# Patient Record
Sex: Male | Born: 1955 | Race: Black or African American | Hispanic: No | Marital: Married | Smoking: Never smoker
Health system: Southern US, Community
[De-identification: ages and names within clinical notes are randomized; demographics above are authoritative.]

## PROBLEM LIST (undated history)

## (undated) DIAGNOSIS — I428 Other cardiomyopathies: Secondary | ICD-10-CM

## (undated) DIAGNOSIS — K219 Gastro-esophageal reflux disease without esophagitis: Secondary | ICD-10-CM

## (undated) DIAGNOSIS — E1165 Type 2 diabetes mellitus with hyperglycemia: Secondary | ICD-10-CM

## (undated) DIAGNOSIS — IMO0002 Reserved for concepts with insufficient information to code with codable children: Secondary | ICD-10-CM

## (undated) DIAGNOSIS — I10 Essential (primary) hypertension: Secondary | ICD-10-CM

## (undated) HISTORY — DX: Other cardiomyopathies: I42.8

---

## 2009-10-14 ENCOUNTER — Encounter: Admission: RE | Admit: 2009-10-14 | Discharge: 2009-10-30 | Payer: Self-pay | Admitting: Orthopedic Surgery

## 2009-11-03 ENCOUNTER — Encounter: Admission: RE | Admit: 2009-11-03 | Discharge: 2009-11-03 | Payer: Self-pay | Admitting: Orthopedic Surgery

## 2013-01-11 ENCOUNTER — Emergency Department (HOSPITAL_COMMUNITY)
Admission: EM | Admit: 2013-01-11 | Discharge: 2013-01-11 | Disposition: A | Discharge status: Home / Self Care | Payer: Self-pay | Attending: Emergency Medicine | Admitting: Emergency Medicine

## 2013-01-11 ENCOUNTER — Encounter (HOSPITAL_COMMUNITY): Payer: Self-pay | Admitting: Emergency Medicine

## 2013-01-11 ENCOUNTER — Emergency Department (HOSPITAL_COMMUNITY): Payer: Self-pay

## 2013-01-11 DIAGNOSIS — R109 Unspecified abdominal pain: Secondary | ICD-10-CM | POA: Insufficient documentation

## 2013-01-11 DIAGNOSIS — Z8719 Personal history of other diseases of the digestive system: Secondary | ICD-10-CM | POA: Insufficient documentation

## 2013-01-11 DIAGNOSIS — E119 Type 2 diabetes mellitus without complications: Secondary | ICD-10-CM | POA: Insufficient documentation

## 2013-01-11 DIAGNOSIS — K529 Noninfective gastroenteritis and colitis, unspecified: Secondary | ICD-10-CM

## 2013-01-11 DIAGNOSIS — K5289 Other specified noninfective gastroenteritis and colitis: Secondary | ICD-10-CM | POA: Insufficient documentation

## 2013-01-11 DIAGNOSIS — IMO0001 Reserved for inherently not codable concepts without codable children: Secondary | ICD-10-CM | POA: Insufficient documentation

## 2013-01-11 DIAGNOSIS — N2889 Other specified disorders of kidney and ureter: Secondary | ICD-10-CM | POA: Insufficient documentation

## 2013-01-11 DIAGNOSIS — N289 Disorder of kidney and ureter, unspecified: Secondary | ICD-10-CM

## 2013-01-11 DIAGNOSIS — I10 Essential (primary) hypertension: Secondary | ICD-10-CM | POA: Insufficient documentation

## 2013-01-11 HISTORY — DX: Gastro-esophageal reflux disease without esophagitis: K21.9

## 2013-01-11 LAB — URINE MICROSCOPIC-ADD ON

## 2013-01-11 LAB — URINALYSIS, ROUTINE W REFLEX MICROSCOPIC
Glucose, UA: >1000 mg/dL — AB
Leukocytes, UA: NEGATIVE
Protein, ur: NEGATIVE mg/dL
Urobilinogen, UA: 0.2 mg/dL (ref 0.0–1.0)

## 2013-01-11 LAB — COMPREHENSIVE METABOLIC PANEL
Albumin: 4.1 g/dL (ref 3.5–5.2)
BUN: 22 mg/dL (ref 6–23)
Chloride: 98 mEq/L (ref 96–112)
Creatinine, Ser: 1.52 mg/dL — ABNORMAL HIGH (ref 0.50–1.35)
GFR calc non Af Amer: 49 mL/min — ABNORMAL LOW (ref >90)
Total Bilirubin: 0.3 mg/dL (ref 0.3–1.2)

## 2013-01-11 LAB — CBC WITH DIFFERENTIAL/PLATELET
Basophils Relative: 0 % (ref 0–1)
Eosinophils Relative: 1 % (ref 0–5)
HCT: 37.7 % — ABNORMAL LOW (ref 39.0–52.0)
Hemoglobin: 12.7 g/dL — ABNORMAL LOW (ref 13.0–17.0)
MCH: 26.1 pg (ref 26.0–34.0)
MCHC: 33.7 g/dL (ref 30.0–36.0)
MCV: 77.6 fL — ABNORMAL LOW (ref 78.0–100.0)
Monocytes Absolute: 0.9 10*3/uL (ref 0.1–1.0)
Monocytes Relative: 7 % (ref 3–12)
Neutro Abs: 12.7 10*3/uL — ABNORMAL HIGH (ref 1.7–7.7)

## 2013-01-11 LAB — LIPASE, BLOOD: Lipase: 30 U/L (ref 11–59)

## 2013-01-11 LAB — HEPATIC FUNCTION PANEL: Total Protein: 8.7 g/dL — ABNORMAL HIGH (ref 6.0–8.3)

## 2013-01-11 MED ORDER — IOHEXOL 300 MG/ML  SOLN
50.0000 mL | Freq: Once | INTRAMUSCULAR | Status: AC | PRN
  Administered 2013-01-11: 50 mL via ORAL

## 2013-01-11 MED ORDER — ONDANSETRON HCL 4 MG/2ML IJ SOLN
4.0000 mg | Freq: Once | INTRAMUSCULAR | Status: AC
  Administered 2013-01-11: 4 mg via INTRAVENOUS
  Filled 2013-01-11: qty 2

## 2013-01-11 MED ORDER — SODIUM CHLORIDE 0.9 % IV BOLUS (SEPSIS)
1000.0000 mL | Freq: Once | INTRAVENOUS | Status: AC
  Administered 2013-01-11: 1000 mL via INTRAVENOUS

## 2013-01-11 MED ORDER — METRONIDAZOLE 500 MG PO TABS: 500.0000 mg | ORAL_TABLET | Freq: Three times a day (TID) | ORAL | Status: DC

## 2013-01-11 MED ORDER — CIPROFLOXACIN HCL 500 MG PO TABS: 500.0000 mg | ORAL_TABLET | Freq: Two times a day (BID) | ORAL | Status: DC

## 2013-01-11 MED ORDER — FENTANYL CITRATE 0.05 MG/ML IJ SOLN
50.0000 ug | Freq: Once | INTRAMUSCULAR | Status: AC
  Administered 2013-01-11: 50 ug via INTRAVENOUS
  Filled 2013-01-11 (×2): qty 2

## 2013-01-11 NOTE — ED Provider Notes (Signed)
History     CSN: [id number]  Arrival date & time 01/11/13  0137   First MD Initiated Contact with Patient 01/11/13 [phone]      Chief Complaint  Patient presents with  . Emesis    (Consider location/radiation/quality/duration/timing/severity/associated sxs/prior treatment) Patient is a 57 y.o. male presenting with vomiting. The history is provided by the patient.  Emesis Severity:  Moderate Timing:  Constant Number of daily episodes:  3 Quality:  Stomach contents Progression:  Unchanged Chronicity:  New Recent urination:  Normal Context: not self-induced   Relieved by:  Nothing Worsened by:  Nothing tried Ineffective treatments:  None tried Associated symptoms: abdominal pain and myalgias   Associated symptoms: no diarrhea   Abdominal pain:    Location:  Suprapubic   Quality:  Cramping   Severity:  Moderate   Onset quality:  Gradual   Timing:  Constant   Progression:  Unchanged   Chronicity:  New Risk factors: diabetes     Past Medical History  Diagnosis Date  . Diabetes mellitus without complication   . Hypertension   . GERD (gastroesophageal reflux disease)     History reviewed. No pertinent past surgical history.  No family history on file.  History  Substance Use Topics  . Smoking status: Never Smoker   . Smokeless tobacco: Not on file  . Alcohol Use: No      Review of Systems  Respiratory: Negative for shortness of breath.   Cardiovascular: Negative for chest pain.  Gastrointestinal: Positive for nausea, vomiting and abdominal pain. Negative for diarrhea.  Musculoskeletal: Positive for myalgias.  All other systems reviewed and are negative.    Allergies  Review of patient's allergies indicates no known allergies.  Home Medications  No current outpatient prescriptions on file.  BP 133/91  Pulse 126  Temp(Src) 98.7 F (37.1 C) (Oral)  Resp 16  Ht 5' 11.5" (1.816 m)  Wt 173 lb (78.472 kg)  BMI 23.79 kg/m2  SpO2 97%  Physical Exam   Constitutional: He is oriented to person, place, and time. He appears well-developed and well-nourished. No distress.  HENT:  Head: Normocephalic and atraumatic.  Mouth/Throat: Oropharynx is clear and moist.  Eyes: Conjunctivae are normal. Pupils are equal, round, and reactive to light.  Neck: Normal range of motion. Neck supple.  Cardiovascular: Tachycardia present.   Pulmonary/Chest: Effort normal and breath sounds normal. He has no wheezes. He has no rales.  Abdominal: Soft. Bowel sounds are normal. There is no tenderness. There is no rebound and no guarding.  Musculoskeletal: Normal range of motion. He exhibits no edema.  Neurological: He is alert and oriented to person, place, and time.  Skin: Skin is warm and dry.  Psychiatric: He has a normal mood and affect.    ED Course  Procedures (including critical care time)  Labs Reviewed  CBC WITH DIFFERENTIAL - Abnormal; Notable for the following:    WBC 14.4 (*)    Hemoglobin 12.7 (*)    HCT 37.7 (*)    MCV 77.6 (*)    Neutrophils Relative 89 (*)    Neutro Abs 12.7 (*)    Lymphocytes Relative 4 (*)    Lymphs Abs 0.5 (*)    All other components within normal limits  COMPREHENSIVE METABOLIC PANEL - Abnormal; Notable for the following:    Glucose, Bld 290 (*)    Creatinine, Ser 1.52 (*)    Total Protein 8.5 (*)    GFR calc non Af Amer 49 (*)  GFR calc Af Amer 57 (*)    All other components within normal limits  URINALYSIS, ROUTINE W REFLEX MICROSCOPIC  URINALYSIS, ROUTINE W REFLEX MICROSCOPIC  HEPATIC FUNCTION PANEL  LIPASE, BLOOD   Dg Abd Acute W/chest  01/11/2013  *RADIOLOGY REPORT*  Clinical Data: Abdominal pain, vomiting.  ACUTE ABDOMEN SERIES (ABDOMEN 2 VIEW & CHEST 1 VIEW)  Comparison: None.  Findings: Lungs are clear.  Cardiomediastinal contours within normal range.  No free intraperitoneal air. The bowel gas pattern is non- obstructive. Organ outlines are normal where seen. No acute or aggressive osseous abnormality  identified.  IMPRESSION: Nonobstructive bowel gas pattern.   Original Report Authenticated By: Jearld Lesch, M.D.      No diagnosis found.    MDM  Tachycardia resolved with IVF.  Symptoms consistent with infectious enteritis.  Follow up for outpatient endoscopy and outpatient renal ultrasound to further characterize lesion seen on CT. Return for fevers over 101, recurrent abdominal pain intractable vomiting or any concerns.  Both patient and wife verbalize understanding and agree to follow up        April Smitty Cords, MD 01/11/13 [phone]
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
## 2013-09-27 ENCOUNTER — Encounter (HOSPITAL_COMMUNITY): Payer: Self-pay | Admitting: Emergency Medicine

## 2013-09-27 ENCOUNTER — Emergency Department (HOSPITAL_COMMUNITY): Payer: No Typology Code available for payment source

## 2013-09-27 ENCOUNTER — Emergency Department (HOSPITAL_COMMUNITY)
Admission: EM | Admit: 2013-09-27 | Discharge: 2013-09-27 | Disposition: A | Discharge status: Home / Self Care | Payer: Self-pay | Attending: Emergency Medicine | Admitting: Emergency Medicine

## 2013-09-27 DIAGNOSIS — S4980XA Other specified injuries of shoulder and upper arm, unspecified arm, initial encounter: Secondary | ICD-10-CM | POA: Insufficient documentation

## 2013-09-27 DIAGNOSIS — Y92410 Unspecified street and highway as the place of occurrence of the external cause: Secondary | ICD-10-CM | POA: Insufficient documentation

## 2013-09-27 DIAGNOSIS — E119 Type 2 diabetes mellitus without complications: Secondary | ICD-10-CM | POA: Insufficient documentation

## 2013-09-27 DIAGNOSIS — Y9389 Activity, other specified: Secondary | ICD-10-CM | POA: Insufficient documentation

## 2013-09-27 DIAGNOSIS — IMO0002 Reserved for concepts with insufficient information to code with codable children: Secondary | ICD-10-CM | POA: Insufficient documentation

## 2013-09-27 DIAGNOSIS — S46909A Unspecified injury of unspecified muscle, fascia and tendon at shoulder and upper arm level, unspecified arm, initial encounter: Secondary | ICD-10-CM | POA: Insufficient documentation

## 2013-09-27 DIAGNOSIS — I10 Essential (primary) hypertension: Secondary | ICD-10-CM | POA: Insufficient documentation

## 2013-09-27 DIAGNOSIS — S298XXA Other specified injuries of thorax, initial encounter: Secondary | ICD-10-CM | POA: Insufficient documentation

## 2013-09-27 DIAGNOSIS — Z8719 Personal history of other diseases of the digestive system: Secondary | ICD-10-CM | POA: Insufficient documentation

## 2013-09-27 DIAGNOSIS — Z79899 Other long term (current) drug therapy: Secondary | ICD-10-CM | POA: Insufficient documentation

## 2013-09-27 DIAGNOSIS — R0789 Other chest pain: Secondary | ICD-10-CM

## 2013-09-27 MED ORDER — OXYCODONE-ACETAMINOPHEN 5-325 MG PO TABS
1.0000 | ORAL_TABLET | Freq: Once | ORAL | Status: AC
  Administered 2013-09-27: 1 via ORAL
  Filled 2013-09-27: qty 1

## 2013-09-27 NOTE — ED Notes (Signed)
Small cut above the rt eye

## 2013-09-27 NOTE — ED Notes (Signed)
The pt has no other pain except  For his chest with movement.  Warm blanket given
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
## 2013-09-27 NOTE — ED Notes (Signed)
The pt just returned from xray 
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
## 2013-09-27 NOTE — ED Notes (Signed)
Med given  Pt to xray.    

## 2013-09-27 NOTE — ED Notes (Signed)
The pt is ready for discharge  Dressed and waiting.  His papers are not ready

## 2013-09-27 NOTE — ED Notes (Signed)
Cut rt eyebrow cleaned with soap and water

## 2013-09-27 NOTE — ED Notes (Signed)
The pt is still waiting to be seen.   Another blanket given
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
## 2014-12-31 DIAGNOSIS — I428 Other cardiomyopathies: Secondary | ICD-10-CM

## 2014-12-31 HISTORY — DX: Other cardiomyopathies: I42.8

## 2015-01-04 ENCOUNTER — Other Ambulatory Visit (HOSPITAL_COMMUNITY): Payer: Self-pay

## 2015-01-04 ENCOUNTER — Inpatient Hospital Stay (HOSPITAL_COMMUNITY)
Admission: EM | Admit: 2015-01-04 | Discharge: 2015-01-08 | DRG: 286 | Disposition: A | Discharge status: Home / Self Care | Payer: Self-pay | Attending: Internal Medicine | Admitting: Internal Medicine

## 2015-01-04 ENCOUNTER — Emergency Department (HOSPITAL_COMMUNITY): Payer: Self-pay

## 2015-01-04 ENCOUNTER — Encounter (HOSPITAL_COMMUNITY): Payer: Self-pay | Admitting: Physical Medicine and Rehabilitation

## 2015-01-04 DIAGNOSIS — J811 Chronic pulmonary edema: Secondary | ICD-10-CM | POA: Diagnosis present

## 2015-01-04 DIAGNOSIS — I428 Other cardiomyopathies: Secondary | ICD-10-CM | POA: Insufficient documentation

## 2015-01-04 DIAGNOSIS — Z23 Encounter for immunization: Secondary | ICD-10-CM

## 2015-01-04 DIAGNOSIS — K219 Gastro-esophageal reflux disease without esophagitis: Secondary | ICD-10-CM | POA: Diagnosis present

## 2015-01-04 DIAGNOSIS — I5041 Acute combined systolic (congestive) and diastolic (congestive) heart failure: Principal | ICD-10-CM | POA: Diagnosis present

## 2015-01-04 DIAGNOSIS — T502X5A Adverse effect of carbonic-anhydrase inhibitors, benzothiadiazides and other diuretics, initial encounter: Secondary | ICD-10-CM | POA: Diagnosis not present

## 2015-01-04 DIAGNOSIS — I2510 Atherosclerotic heart disease of native coronary artery without angina pectoris: Secondary | ICD-10-CM | POA: Diagnosis present

## 2015-01-04 DIAGNOSIS — J9600 Acute respiratory failure, unspecified whether with hypoxia or hypercapnia: Secondary | ICD-10-CM | POA: Diagnosis present

## 2015-01-04 DIAGNOSIS — R0602 Shortness of breath: Secondary | ICD-10-CM

## 2015-01-04 DIAGNOSIS — E785 Hyperlipidemia, unspecified: Secondary | ICD-10-CM | POA: Diagnosis present

## 2015-01-04 DIAGNOSIS — E876 Hypokalemia: Secondary | ICD-10-CM | POA: Diagnosis present

## 2015-01-04 DIAGNOSIS — I420 Dilated cardiomyopathy: Secondary | ICD-10-CM | POA: Diagnosis present

## 2015-01-04 DIAGNOSIS — I5042 Chronic combined systolic (congestive) and diastolic (congestive) heart failure: Secondary | ICD-10-CM | POA: Clinically undetermined

## 2015-01-04 DIAGNOSIS — E1165 Type 2 diabetes mellitus with hyperglycemia: Secondary | ICD-10-CM | POA: Diagnosis present

## 2015-01-04 DIAGNOSIS — IMO0002 Reserved for concepts with insufficient information to code with codable children: Secondary | ICD-10-CM | POA: Diagnosis present

## 2015-01-04 DIAGNOSIS — I25119 Atherosclerotic heart disease of native coronary artery with unspecified angina pectoris: Secondary | ICD-10-CM | POA: Diagnosis present

## 2015-01-04 DIAGNOSIS — J810 Acute pulmonary edema: Secondary | ICD-10-CM

## 2015-01-04 DIAGNOSIS — D649 Anemia, unspecified: Secondary | ICD-10-CM | POA: Diagnosis present

## 2015-01-04 DIAGNOSIS — I10 Essential (primary) hypertension: Secondary | ICD-10-CM | POA: Diagnosis present

## 2015-01-04 DIAGNOSIS — E119 Type 2 diabetes mellitus without complications: Secondary | ICD-10-CM

## 2015-01-04 HISTORY — DX: Type 2 diabetes mellitus with hyperglycemia: E11.65

## 2015-01-04 HISTORY — DX: Essential (primary) hypertension: I10

## 2015-01-04 HISTORY — DX: Reserved for concepts with insufficient information to code with codable children: IMO0002

## 2015-01-04 LAB — CBC WITH DIFFERENTIAL/PLATELET
BASOS ABS: 0 10*3/uL (ref 0.0–0.1)
Basophils Relative: 0 % (ref 0–1)
EOS ABS: 0.1 10*3/uL (ref 0.0–0.7)
EOS PCT: 1 % (ref 0–5)
HEMATOCRIT: 36.6 % — AB (ref 39.0–52.0)
Hemoglobin: 11.9 g/dL — ABNORMAL LOW (ref 13.0–17.0)
LYMPHS PCT: 18 % (ref 12–46)
Lymphs Abs: 1.8 10*3/uL (ref 0.7–4.0)
MCH: 26 pg (ref 26.0–34.0)
MCHC: 32.5 g/dL (ref 30.0–36.0)
MCV: 79.9 fL (ref 78.0–100.0)
MONO ABS: 0.7 10*3/uL (ref 0.1–1.0)
MONOS PCT: 7 % (ref 3–12)
Neutro Abs: 7.4 10*3/uL (ref 1.7–7.7)
Neutrophils Relative %: 74 % (ref 43–77)
Platelets: 234 10*3/uL (ref 150–400)
RBC: 4.58 MIL/uL (ref 4.22–5.81)
RDW: 15.8 % — ABNORMAL HIGH (ref 11.5–15.5)
WBC: 10 10*3/uL (ref 4.0–10.5)

## 2015-01-04 LAB — COMPREHENSIVE METABOLIC PANEL
ALBUMIN: 3.6 g/dL (ref 3.5–5.2)
ALT: 8 U/L (ref 0–53)
AST: 18 U/L (ref 0–37)
Alkaline Phosphatase: 59 U/L (ref 39–117)
Anion gap: 12 (ref 5–15)
BUN: 12 mg/dL (ref 6–23)
CALCIUM: 8.9 mg/dL (ref 8.4–10.5)
CHLORIDE: 105 mmol/L (ref 96–112)
CO2: 25 mmol/L (ref 19–32)
Creatinine, Ser: 1.01 mg/dL (ref 0.50–1.35)
GFR calc Af Amer: >90 mL/min (ref >90)
GFR calc non Af Amer: 79 mL/min — ABNORMAL LOW (ref >90)
Glucose, Bld: 122 mg/dL — ABNORMAL HIGH (ref 70–99)
Potassium: 3.1 mmol/L — ABNORMAL LOW (ref 3.5–5.1)
Sodium: 142 mmol/L (ref 135–145)
Total Bilirubin: 0.7 mg/dL (ref 0.3–1.2)
Total Protein: 7.8 g/dL (ref 6.0–8.3)

## 2015-01-04 LAB — MAGNESIUM: Magnesium: 1.6 mg/dL (ref 1.5–2.5)

## 2015-01-04 LAB — I-STAT TROPONIN, ED: TROPONIN I, POC: 0.02 ng/mL (ref 0.00–0.08)

## 2015-01-04 LAB — D-DIMER, QUANTITATIVE (NOT AT ARMC): D-Dimer, Quant: 1.04 ug/mL-FEU — ABNORMAL HIGH (ref 0.00–0.48)

## 2015-01-04 LAB — BRAIN NATRIURETIC PEPTIDE: B Natriuretic Peptide: 463 pg/mL — ABNORMAL HIGH (ref 0.0–100.0)

## 2015-01-04 MED ORDER — PRAVASTATIN SODIUM 20 MG PO TABS
20.0000 mg | ORAL_TABLET | Freq: Every day | ORAL | Status: DC
  Administered 2015-01-05 – 2015-01-07 (×3): 20 mg via ORAL
  Filled 2015-01-04 (×4): qty 1

## 2015-01-04 MED ORDER — LISINOPRIL 40 MG PO TABS: 40.0000 mg | ORAL_TABLET | Freq: Every day | ORAL | Status: DC

## 2015-01-04 MED ORDER — ASPIRIN EC 81 MG PO TBEC
81.0000 mg | DELAYED_RELEASE_TABLET | Freq: Every day | ORAL | Status: DC
  Administered 2015-01-05 – 2015-01-07 (×3): 81 mg via ORAL
  Filled 2015-01-04 (×4): qty 1

## 2015-01-04 MED ORDER — SODIUM CHLORIDE 0.9 % IJ SOLN: 3.0000 mL | Freq: Two times a day (BID) | INTRAMUSCULAR | Status: DC

## 2015-01-04 MED ORDER — FUROSEMIDE 10 MG/ML IJ SOLN
40.0000 mg | Freq: Once | INTRAMUSCULAR | Status: AC
  Administered 2015-01-04: 40 mg via INTRAVENOUS
  Filled 2015-01-04: qty 4

## 2015-01-04 MED ORDER — ACETAMINOPHEN 325 MG PO TABS: 650.0000 mg | ORAL_TABLET | Freq: Four times a day (QID) | ORAL | Status: DC | PRN

## 2015-01-04 MED ORDER — ENOXAPARIN SODIUM 40 MG/0.4ML ~~LOC~~ SOLN
40.0000 mg | Freq: Every day | SUBCUTANEOUS | Status: DC
  Administered 2015-01-05 – 2015-01-06 (×2): 40 mg via SUBCUTANEOUS
  Filled 2015-01-04 (×3): qty 0.4

## 2015-01-04 MED ORDER — SODIUM CHLORIDE 0.9 % IJ SOLN
3.0000 mL | Freq: Two times a day (BID) | INTRAMUSCULAR | Status: DC
  Administered 2015-01-04 – 2015-01-08 (×7): 3 mL via INTRAVENOUS

## 2015-01-04 MED ORDER — ASPIRIN 325 MG PO TABS
325.0000 mg | ORAL_TABLET | Freq: Once | ORAL | Status: AC
  Administered 2015-01-05: 325 mg via ORAL
  Filled 2015-01-04 (×2): qty 1

## 2015-01-04 MED ORDER — MAGNESIUM SULFATE 2 GM/50ML IV SOLN
2.0000 g | Freq: Once | INTRAVENOUS | Status: AC
  Administered 2015-01-04: 2 g via INTRAVENOUS
  Filled 2015-01-04: qty 50

## 2015-01-04 MED ORDER — POTASSIUM CHLORIDE CRYS ER 20 MEQ PO TBCR
40.0000 meq | EXTENDED_RELEASE_TABLET | Freq: Once | ORAL | Status: AC
  Administered 2015-01-04: 40 meq via ORAL
  Filled 2015-01-04: qty 2

## 2015-01-04 MED ORDER — ACETAMINOPHEN 325 MG PO TABS
650.0000 mg | ORAL_TABLET | Freq: Once | ORAL | Status: AC
  Administered 2015-01-04: 650 mg via ORAL
  Filled 2015-01-04: qty 2

## 2015-01-04 MED ORDER — IOHEXOL 350 MG/ML SOLN
90.0000 mL | Freq: Once | INTRAVENOUS | Status: AC | PRN
  Administered 2015-01-04: 90 mL via INTRAVENOUS

## 2015-01-04 MED ORDER — ACETAMINOPHEN 650 MG RE SUPP: 650.0000 mg | Freq: Four times a day (QID) | RECTAL | Status: DC | PRN

## 2015-01-04 MED ORDER — INSULIN ASPART 100 UNIT/ML ~~LOC~~ SOLN
0.0000 [IU] | Freq: Three times a day (TID) | SUBCUTANEOUS | Status: DC
  Administered 2015-01-05 (×2): 2 [IU] via SUBCUTANEOUS
  Administered 2015-01-05: 1 [IU] via SUBCUTANEOUS
  Administered 2015-01-06: 2 [IU] via SUBCUTANEOUS
  Administered 2015-01-06: 3 [IU] via SUBCUTANEOUS
  Administered 2015-01-06 – 2015-01-07 (×2): 2 [IU] via SUBCUTANEOUS
  Administered 2015-01-08: 1 [IU] via SUBCUTANEOUS
  Administered 2015-01-08 (×2): 2 [IU] via SUBCUTANEOUS

## 2015-01-04 MED ORDER — ASPIRIN 325 MG PO TABS: 325.0000 mg | ORAL_TABLET | Freq: Every day | ORAL | Status: DC

## 2015-01-04 NOTE — ED Notes (Signed)
Pt ambulated in hall with o2 dropping to 92% and pulse going to 122. Pt stated he felt he needed to walk slower and felt like he was breathing harder. Pt with increased respirations while ambulating.

## 2015-01-04 NOTE — ED Notes (Signed)
Called main lab.Main lab will result BNP

## 2015-01-04 NOTE — ED Notes (Signed)
Pt placed into gown and on monitor upon arrival to room. Pt monitored by blood pressure, pulse ox, and 5 lead.  

## 2015-01-04 NOTE — ED Provider Notes (Signed)
CSN: [id number]     Arrival date & time 01/04/15  1053 History   First MD Initiated Contact with Patient 01/04/15 1148     Chief Complaint  Patient presents with  . Shortness of Breath     (Consider location/radiation/quality/duration/timing/severity/associated sxs/prior Treatment) HPI Comments: Patient is a 59 year old male with a past medical history of hypertension, diabetes, and GERD who presents with shortness of breath for the past 3 days. Symptoms started gradually and progressively worsened since the onset. Patient denies chest pain. SOB worse when laying flat. No alleviating factors. No fevers. Patient reports a dry cough. His PCP wanted him to be evaluated for CHF. Patient denies history of DVT/PE, recent surgery/travel/immobilization.    Past Medical History  Diagnosis Date  . Diabetes mellitus without complication   . Hypertension   . GERD (gastroesophageal reflux disease)    History reviewed. No pertinent past surgical history. History reviewed. No pertinent family history. History  Substance Use Topics  . Smoking status: Never Smoker   . Smokeless tobacco: Not on file  . Alcohol Use: No    Review of Systems  Constitutional: Negative for fever, chills and fatigue.  HENT: Negative for trouble swallowing.   Eyes: Negative for visual disturbance.  Respiratory: Positive for shortness of breath.   Cardiovascular: Negative for chest pain and palpitations.  Gastrointestinal: Negative for nausea, vomiting, abdominal pain and diarrhea.  Genitourinary: Negative for dysuria and difficulty urinating.  Musculoskeletal: Negative for arthralgias and neck pain.  Skin: Negative for color change.  Neurological: Negative for dizziness and weakness.  Psychiatric/Behavioral: Negative for dysphoric mood.      Allergies  Review of patient's allergies indicates no known allergies.  Home Medications   Prior to Admission medications   Medication Sig Start Date End Date Taking?  Authorizing Provider  chlorthalidone (HYGROTON) 25 MG tablet Take 25 mg by mouth daily.    Historical Provider, MD  Liraglutide (VICTOZA [place]) Inject 1.2 mLs into the skin daily.    Historical Provider, MD  lisinopril-hydrochlorothiazide (PRINZIDE,ZESTORETIC) 10-12.5 MG per tablet Take 1 tablet by mouth 2 (two) times daily.    Historical Provider, MD   BP 159/99 mmHg  Pulse 120  Temp(Src) 97.6 F (36.4 C) (Oral)  Resp 18  Ht 5\' 11"  (1.803 m)  Wt 170 lb (77.111 kg)  BMI 23.72 kg/m2  SpO2 98% Physical Exam  Constitutional: He is oriented to person, place, and time. He appears well-developed and well-nourished. No distress.  HENT:  Head: Normocephalic and atraumatic.  Eyes: Conjunctivae and EOM are normal.  Neck: Normal range of motion.  Cardiovascular: Normal rate and regular rhythm.  Exam reveals no gallop and no friction rub.   No murmur heard. Pulmonary/Chest: Effort normal and breath sounds normal. He has no wheezes. He has no rales. He exhibits no tenderness.  Diminished breath sounds in bilateral lower lobes.   Abdominal: Soft. He exhibits no distension. There is no tenderness. There is no rebound.  Musculoskeletal: Normal range of motion.  Neurological: He is alert and oriented to person, place, and time. Coordination normal.  Speech is goal-oriented. Moves limbs without ataxia.   Skin: Skin is warm and dry.  Psychiatric: He has a normal mood and affect. His behavior is normal.  Nursing note and vitals reviewed.   ED Course  Procedures (including critical care time) Labs Review Labs Reviewed  CBC WITH DIFFERENTIAL/PLATELET - Abnormal; Notable for the following:    Hemoglobin 11.9 (*)    HCT 36.6 (*)  RDW 15.8 (*)    All other components within normal limits  COMPREHENSIVE METABOLIC PANEL - Abnormal; Notable for the following:    Potassium 3.1 (*)    Glucose, Bld 122 (*)    GFR calc non Af Amer 79 (*)    All other components within normal limits  D-DIMER,  QUANTITATIVE - Abnormal; Notable for the following:    D-Dimer, Quant 1.04 (*)    All other components within normal limits  BRAIN NATRIURETIC PEPTIDE - Abnormal; Notable for the following:    B Natriuretic Peptide 463.0 (*)    All other components within normal limits  BASIC METABOLIC PANEL - Abnormal; Notable for the following:    Potassium 3.1 (*)    Glucose, Bld 148 (*)    GFR calc non Af Amer 74 (*)    GFR calc Af Amer 85 (*)    All other components within normal limits  TROPONIN I - Abnormal; Notable for the following:    Troponin I 0.04 (*)    All other components within normal limits  TROPONIN I - Abnormal; Notable for the following:    Troponin I 0.05 (*)    All other components within normal limits  GLUCOSE, CAPILLARY - Abnormal; Notable for the following:    Glucose-Capillary 130 (*)    All other components within normal limits  MAGNESIUM  TSH  LIPID PANEL  MAGNESIUM  TROPONIN I  HEMOGLOBIN A1C  HIV ANTIBODY (ROUTINE TESTING)  I-STAT TROPOININ, ED    Imaging Review Dg Chest 2 View  01/04/2015   CLINICAL DATA:  Shortness of breath. Abdominal pain and bloating since Thursday. Hypertension and diabetes.  EXAM: CHEST  2 VIEW  COMPARISON:  09/27/2013  FINDINGS: Midline trachea. Normal heart size and mediastinal contours. Trace bilateral pleural fluid. No pneumothorax. mild interstitial thickening, lower lobe predominant. Minimal left base subsegmental atelectasis.  IMPRESSION: Interstitial thickening which is new and suspicious for mild pulmonary venous congestion. Trace bilateral pleural effusions.   Electronically Signed   By: Jeronimo Greaves M.D.   On: 01/04/2015 13:00   Ct Angio Chest Pe W/cm &/or Wo Cm  01/04/2015   CLINICAL DATA:  59 year old male with shortness of breath for 3 days.  EXAM: CT ANGIOGRAPHY CHEST WITH CONTRAST  TECHNIQUE: Multidetector CT imaging of the chest was performed using the standard protocol during bolus administration of intravenous contrast.  Multiplanar CT image reconstructions and MIPs were obtained to evaluate the vascular anatomy.  CONTRAST:  90mL OMNIPAQUE IOHEXOL 350 MG/ML SOLN  COMPARISON:  01/04/2015 and prior chest radiographs. 01/11/2013 abdominal CT.  FINDINGS: This is a technically satisfactory study.  Mediastinum/Nodes: No pulmonary emboli are identified.  There is no evidence of thoracic aortic aneurysm.  Cardiomegaly and moderate coronary artery calcifications are noted. A small infarct at the left ventricular apex is noted. No enlarged lymph nodes are identified.  Lungs/Pleura: Moderate bilateral airspace/ central ground-glass opacities are noted likely representing pulmonary edema. Moderate bibasilar atelectasis is noted.  Patchy right upper lobe airspace/ground-glass opacities likely represent focal edema.  Bilateral pleural effusions, small to moderate on the right and small on the left, noted.  Upper abdomen: Unremarkable  Musculoskeletal: Unremarkable  Review of the MIP images confirms the above findings.  IMPRESSION: Moderate bilateral central airspace/ ground-glass opacities likely representing moderate pulmonary edema. Bilateral pleural effusions, small to moderate on the right and small on the left. Moderate bibasilar atelectasis.  No evidence of pulmonary emboli or thoracic aortic aneurysm.  Cardiomegaly and coronary artery disease.  Small left ventricular apical infarct.   Electronically Signed   By: Harmon Pier M.D.   On: 01/04/2015 16:27     EKG Interpretation None      MDM   Final diagnoses:  SOB (shortness of breath)    1:10 PM Chest xray and labs pending. Patient is tachycardic with remaining vitals stable.   D-dimer elevated. Patient will have CT angio to rule out PE. Patient signed out to Dr. Hyman Hopes.    Emilia Beck, [place] 01/05/15 [phone]

## 2015-01-04 NOTE — ED Notes (Signed)
Pt remains monitored by blood pressure, pulse ox, and 5 lead.  

## 2015-01-04 NOTE — ED Provider Notes (Signed)
Medical screening examination/treatment/procedure(s) were performed by non-physician practitioner and as supervising physician I was immediately available for consultation/collaboration.   EKG Interpretation None      Date: 01/04/2015  Rate: 114  Rhythm: sinus tachycardia  QRS Axis: normal  Intervals: normal  ST/T Wave abnormalities: nonspecific ST changes  Conduction Disutrbances:none  Narrative Interpretation:   Old EKG Reviewed: none available   Patient here with shortness of breath has been worse for several days. Notes orthopnea and dyspnea on exertion. Seen by his doctor and sent here for possible CHF. Chest x-ray with possible heart failure although concern for pulmonary embolism based on history. Chest CT pending at this time patient likely to be admitted to the hospital    Toy Baker, MD 01/04/15 1450

## 2015-01-04 NOTE — ED Notes (Signed)
Pt placed on monitor upon return to room from restroom. Pt monitored by blood pressure, pulse ox, and 5 lead. pts family remains at bedside.

## 2015-01-04 NOTE — ED Notes (Signed)
Pt presents to department for evaluation of SOB. Ongoing for several days. States difficulty breathing becomes worse at night when laying flat. Was seen at PCP this morning and sent to ED for further evaluation of possible CHF. Respirations unlabored. Speaking complete sentences. Denies chest pain. Pt is alert and oriented x4.

## 2015-01-04 NOTE — ED Notes (Signed)
Pt placed on monitor upon return to room from restroom. Pt was able to ambulate to the restroom with no assistance needed. Pt remains monitored by blood pressure, pulse ox, 5 lead. pts family remains at bedside.

## 2015-01-04 NOTE — ED Notes (Signed)
Admitting doctor at bedside 

## 2015-01-04 NOTE — H&P (Signed)
Date: 01/04/2015               Patient Name:  Jerry Bean MRN: [id number]  DOB: 04/25/1956 Age / Sex: 59 y.o., male   PCP: Provider Not In System         Medical Service: Internal Medicine Teaching Service         Attending Physician: Dr. Earl Lagos, MD    First Contact: Dr. Senaida Ores Pager: [phone]  Second Contact: Dr. Mariea Clonts Pager: [phone]       After Hours (After 5p/  First Contact Pager: [phone]  weekends / holidays): Second Contact Pager: [phone]   Chief Complaint: shortness of breath  History of Present Illness: Mr. Jerry Bean is a 59 yo male with PMHx of uncontrolled T2DM, uncontrolled HTN and GERD who presents to the ED with SOB for 3 days, worse laying flat and also moving around. It started 3 days ago and has been progressively worsening making him come to ED today after PCP's suggestion. He cannot lie flat or walk more than few blocks without getting SOB. Has dry cough. He has been using 4 pillows to sleep recently. Denies any fever or any chest pain. No hx of dvt/pe.   BNP 463. K 3.1. Hgb 11.9 trop 0.02. D-dimer 1.04. CT angio neg for PE but  Shows b/l pulm edema.  Meds: Current Facility-Administered Medications  Medication Dose Route Frequency Provider Last Rate Last Dose  . acetaminophen (TYLENOL) tablet 650 mg  650 mg Oral Q6H PRN Baltazar Apo, MD       Or  . acetaminophen (TYLENOL) suppository 650 mg  650 mg Rectal Q6H PRN Baltazar Apo, MD      . Melene Muller ON 01/05/2015] aspirin EC tablet 81 mg  81 mg Oral Daily Baltazar Apo, MD      . aspirin tablet 325 mg  325 mg Oral Once Baltazar Apo, MD      . Melene Muller ON 01/05/2015] enoxaparin (LOVENOX) injection 40 mg  40 mg Subcutaneous Daily Baltazar Apo, MD      . furosemide (LASIX) injection 40 mg  40 mg Intravenous Once Baltazar Apo, MD      . Melene Muller ON 01/05/2015] insulin aspart (novoLOG) injection 0-9 Units  0-9 Units Subcutaneous TID WC Baltazar Apo, MD      . magnesium sulfate IVPB 2 g 50 mL  2 g  Intravenous Once Baltazar Apo, MD      . Melene Muller ON 01/05/2015] pravastatin (PRAVACHOL) tablet 20 mg  20 mg Oral q1800 Baltazar Apo, MD      . sodium chloride 0.9 % injection 3 mL  3 mL Intravenous Q12H Baltazar Apo, MD       Medications Prior to Admission  Medication Sig Dispense Refill  . glipiZIDE (GLUCOTROL) 10 MG tablet Take 10 mg by mouth 2 (two) times daily before a meal.    . lisinopril (PRINIVIL,ZESTRIL) 40 MG tablet Take 40 mg by mouth daily.    Marland Kitchen lisinopril-hydrochlorothiazide (PRINZIDE,ZESTORETIC) 10-12.5 MG per tablet Take 1 tablet by mouth 2 (two) times daily.    Marland Kitchen lovastatin (MEVACOR) 20 MG tablet Take 20 mg by mouth at bedtime.    . metFORMIN (GLUCOPHAGE) 1000 MG tablet Take 1,000 mg by mouth 2 (two) times daily with a meal.    . chlorthalidone (HYGROTON) 25 MG tablet Take 25 mg by mouth daily.    . Liraglutide (VICTOZA [place]) Inject 1.2 mLs into the skin daily.  Allergies: Allergies as of 01/04/2015  . (No Known Allergies)   Past Medical History  Diagnosis Date  . Diabetes mellitus without complication   . Hypertension   . GERD (gastroesophageal reflux disease)    History reviewed. No pertinent past surgical history. History reviewed. No pertinent family history. History   Social History  . Marital Status: Married    Spouse Name: N/A  . Number of Children: N/A  . Years of Education: N/A   Occupational History  . Not on file.   Social History Main Topics  . Smoking status: Never Smoker   . Smokeless tobacco: Not on file  . Alcohol Use: No  . Drug Use: No  . Sexual Activity: Not on file   Other Topics Concern  . Not on file   Social History Narrative   Review of Systems: Review of Systems  Constitutional: Negative for fever, chills and diaphoresis.  HENT: Negative for congestion, nosebleeds, sore throat and tinnitus.   Eyes: Negative for blurred vision, double vision and photophobia.  Respiratory: Positive for cough and shortness of  breath. Negative for hemoptysis, sputum production and wheezing.   Cardiovascular: Positive for orthopnea and leg swelling. Negative for chest pain, palpitations, claudication and PND.  Gastrointestinal: Negative for nausea, vomiting, abdominal pain, diarrhea and blood in stool.  Genitourinary: Negative for dysuria.  Musculoskeletal: Negative.   Skin: Negative for itching and rash.  Neurological: Positive for headaches. Negative for dizziness, seizures, loss of consciousness and weakness.  Endo/Heme/Allergies: Negative.   Psychiatric/Behavioral: Negative.    Physical Exam: Physical Exam  Constitutional: He is oriented to person, place, and time. He appears well-developed and well-nourished. No distress.  Neck:  Mild JVD  Cardiovascular: Normal rate and regular rhythm.  Exam reveals no gallop and no friction rub.   No murmur heard. Respiratory: Effort normal. He has no wheezes. He exhibits no tenderness.  Crackles diffusely.   GI: Soft. He exhibits no distension and no mass. There is no tenderness. There is no guarding.  Musculoskeletal: Normal range of motion. He exhibits no edema or tenderness.  Neurological: He is alert and oriented to person, place, and time. No cranial nerve deficit.  Skin: He is not diaphoretic.    Filed Vitals:   01/04/15 2030 01/04/15 2100 01/04/15 2146 01/04/15 2217  BP: 153/97 152/96 155/103 155/94  Pulse: 115 106 110 102  Temp:   97.9 F (36.6 C)   TempSrc:   Oral   Resp: Height:    (1.803 m)   Weight:   167 lb 3.2 oz (75.841 kg)   SpO2: 97% 96% 95%    Lab results: Basic Metabolic Panel:  Recent Labs  16/10/96 1110  NA 142  K 3.1*  CL 105  CO2 25  GLUCOSE 122*  BUN 12  CREATININE 1.01  CALCIUM 8.9  MG 1.6   Liver Function Tests:  Recent Labs  01/04/15 1110  AST 18  ALT 8  ALKPHOS 59  BILITOT 0.7  PROT 7.8  ALBUMIN 3.6   CBC:  Recent Labs  01/04/15 1110  WBC 10.0  NEUTROABS 7.4  HGB 11.9*  HCT 36.6*    MCV 79.9  PLT 234   D-Dimer:  Recent Labs  01/04/15 1316  DDIMER 1.04*   Imaging results:  Dg Chest 2 View  01/04/2015   CLINICAL DATA:  Shortness of breath. Abdominal pain and bloating since Thursday. Hypertension and diabetes.  EXAM: CHEST  2 VIEW  COMPARISON:  09/27/2013  FINDINGS: Midline trachea. Normal heart size and mediastinal contours. Trace bilateral pleural fluid. No pneumothorax. mild interstitial thickening, lower lobe predominant. Minimal left base subsegmental atelectasis.  IMPRESSION: Interstitial thickening which is new and suspicious for mild pulmonary venous congestion. Trace bilateral pleural effusions.   Electronically Signed   By: Jeronimo Greaves M.D.   On: 01/04/2015 13:00   Ct Angio Chest Pe W/cm &/or Wo Cm  01/04/2015   CLINICAL DATA:  59 year old male with shortness of breath for 3 days.  EXAM: CT ANGIOGRAPHY CHEST WITH CONTRAST  TECHNIQUE: Multidetector CT imaging of the chest was performed using the standard protocol during bolus administration of intravenous contrast. Multiplanar CT image reconstructions and MIPs were obtained to evaluate the vascular anatomy.  CONTRAST:  68mL OMNIPAQUE IOHEXOL 350 MG/ML SOLN  COMPARISON:  01/04/2015 and prior chest radiographs. 01/11/2013 abdominal CT.  FINDINGS: This is a technically satisfactory study.  Mediastinum/Nodes: No pulmonary emboli are identified.  There is no evidence of thoracic aortic aneurysm.  Cardiomegaly and moderate coronary artery calcifications are noted. A small infarct at the left ventricular apex is noted. No enlarged lymph nodes are identified.  Lungs/Pleura: Moderate bilateral airspace/ central ground-glass opacities are noted likely representing pulmonary edema. Moderate bibasilar atelectasis is noted.  Patchy right upper lobe airspace/ground-glass opacities likely represent focal edema.  Bilateral pleural effusions, small to moderate on the right and small on the left, noted.  Upper abdomen: Unremarkable   Musculoskeletal: Unremarkable  Review of the MIP images confirms the above findings.  IMPRESSION: Moderate bilateral central airspace/ ground-glass opacities likely representing moderate pulmonary edema. Bilateral pleural effusions, small to moderate on the right and small on the left. Moderate bibasilar atelectasis.  No evidence of pulmonary emboli or thoracic aortic aneurysm.  Cardiomegaly and coronary artery disease. Small left ventricular apical infarct.   Electronically Signed   By: Harmon Pier M.D.   On: 01/04/2015 16:27    EKG: sinus tach 114, QTC 481, TW flattening V6. LVH.   Assessment & Plan by Problem: Principal Problem:   Pulmonary edema Active Problems:   Uncontrolled hypertension  59 yo male with HTN, DM II here with acute resp failure 2/2 to pulm edema.  Acute resp failure 2/2 to pulm edema, likely in the setting of new CHF CXR suspicious for mild congestion.  CT shows mod airspace effusion/ground glass opacity, of suggestive moderate pulm edema. Also shows cardiomegaly, CAD, and small remote apical infarct.  - has uncontrolled HTN and DM II, was on lisinopril-hctz combo in the past but BP has been higher due to being off HCTZ per patient. Has volume overload on CXR, has mild JVD on exam. Never been on lasix before - will give 40 mg IV lasix once today. Will likely keep her on lasix during hospitalization - strict i/o's, daily weights,  - will check ECHO, trend troponin (denies CP but need to trend it in the setting of acute CHF). - will do risk stratification: check lipid panel, hgba1c, TSH. - gave 1x asa 325mg  and cont 81mg  asa daily. - would recommend a cardiac workup either inpatient vs outpatient.  HTN - was on lisinopril +  Chlorthalidone - was taken off chlorthalidone by PCP for unknown reason recently and his BP has been high since then in 160/90's.  - BP remains high here. Took lisinopril today. Consider restarting tomorrow if Crt is stable. - likely will benefit  from B-blocker once acute episode has resolved and depending on heart failure, AceI, and a diuretic going forward.  Not sure why he was off diuretic.   DM II - no hgba1c in system but patient states >13. At home on victoza + glipizide  + metformin.  - will do SSI here. Check Hgba1c as above.  GERD - on omeprazole at home. Will do protonix here.   HLD - cont statin  Full Code Diet HH Lovenox DVt ppx.  Dispo: Disposition is deferred at this time, awaiting improvement of current medical problems. Anticipated discharge in approximately 1-2 day(s).   The patient does not have a current PCP (Provider Not In System) and does need an [facility] hospital follow-up appointment after discharge.  The patient does have transportation limitations that hinder transportation to clinic appointments.  Signed:  Hyacinth Meeker, M.D PGY-1 Internal Medicine Pager [phone]

## 2015-01-04 NOTE — ED Notes (Signed)
Pt remains monitored by blood pressure, pulse ox, and 12 lead. pts family remains at bedside.  

## 2015-01-05 DIAGNOSIS — R0602 Shortness of breath: Secondary | ICD-10-CM | POA: Insufficient documentation

## 2015-01-05 DIAGNOSIS — I509 Heart failure, unspecified: Secondary | ICD-10-CM

## 2015-01-05 HISTORY — PX: TRANSTHORACIC ECHOCARDIOGRAM: SHX275

## 2015-01-05 LAB — TROPONIN I
TROPONIN I: 0.04 ng/mL — AB (ref <0.031)
TROPONIN I: 0.05 ng/mL — AB (ref <0.031)
TROPONIN I: 0.04 ng/mL — AB (ref <0.031)

## 2015-01-05 LAB — BASIC METABOLIC PANEL
ANION GAP: 5 (ref 5–15)
Anion gap: 9 (ref 5–15)
BUN: 10 mg/dL (ref 6–23)
BUN: 13 mg/dL (ref 6–23)
CALCIUM: 9 mg/dL (ref 8.4–10.5)
CALCIUM: 9.3 mg/dL (ref 8.4–10.5)
CHLORIDE: 105 mmol/L (ref 96–112)
CO2: 27 mmol/L (ref 19–32)
CO2: 31 mmol/L (ref 19–32)
CREATININE: 1.07 mg/dL (ref 0.50–1.35)
Chloride: 106 mmol/L (ref 96–112)
Creatinine, Ser: 1.23 mg/dL (ref 0.50–1.35)
GFR calc non Af Amer: 74 mL/min — ABNORMAL LOW (ref >90)
GFR, EST AFRICAN AMERICAN: 85 mL/min — AB (ref >90)
GFR, EST AFRICAN AMERICAN: 72 mL/min — AB (ref >90)
GFR, EST NON AFRICAN AMERICAN: 62 mL/min — AB (ref >90)
GLUCOSE: 165 mg/dL — AB (ref 70–99)
Glucose, Bld: 148 mg/dL — ABNORMAL HIGH (ref 70–99)
POTASSIUM: 3.1 mmol/L — AB (ref 3.5–5.1)
Potassium: 3.3 mmol/L — ABNORMAL LOW (ref 3.5–5.1)
Sodium: 142 mmol/L (ref 135–145)
Sodium: 141 mmol/L (ref 135–145)

## 2015-01-05 LAB — GLUCOSE, CAPILLARY
Glucose-Capillary: 125 mg/dL — ABNORMAL HIGH (ref 70–99)
Glucose-Capillary: 130 mg/dL — ABNORMAL HIGH (ref 70–99)
Glucose-Capillary: 157 mg/dL — ABNORMAL HIGH (ref 70–99)
Glucose-Capillary: 160 mg/dL — ABNORMAL HIGH (ref 70–99)
Glucose-Capillary: 187 mg/dL — ABNORMAL HIGH (ref 70–99)

## 2015-01-05 LAB — LIPID PANEL
CHOL/HDL RATIO: 3.5 ratio
Cholesterol: 154 mg/dL (ref 0–200)
HDL: 44 mg/dL (ref >39)
LDL Cholesterol: 90 mg/dL (ref 0–99)
Triglycerides: 102 mg/dL (ref <150)
VLDL: 20 mg/dL (ref 0–40)

## 2015-01-05 LAB — TSH: TSH: 3.353 u[IU]/mL (ref 0.350–4.500)

## 2015-01-05 LAB — MAGNESIUM: Magnesium: 2 mg/dL (ref 1.5–2.5)

## 2015-01-05 MED ORDER — PNEUMOCOCCAL VAC POLYVALENT 25 MCG/0.5ML IJ INJ
0.5000 mL | INJECTION | INTRAMUSCULAR | Status: AC
  Administered 2015-01-06: 0.5 mL via INTRAMUSCULAR
  Filled 2015-01-05: qty 0.5

## 2015-01-05 MED ORDER — POTASSIUM CHLORIDE CRYS ER 20 MEQ PO TBCR
40.0000 meq | EXTENDED_RELEASE_TABLET | Freq: Two times a day (BID) | ORAL | Status: DC
  Administered 2015-01-05 – 2015-01-07 (×4): 40 meq via ORAL
  Filled 2015-01-05 (×5): qty 2

## 2015-01-05 MED ORDER — PANTOPRAZOLE SODIUM 40 MG PO TBEC
40.0000 mg | DELAYED_RELEASE_TABLET | Freq: Every day | ORAL | Status: DC
Start: 2015-01-05 — End: 2015-01-06
  Administered 2015-01-05 – 2015-01-06 (×2): 40 mg via ORAL
  Filled 2015-01-05: qty 1

## 2015-01-05 MED ORDER — LISINOPRIL 40 MG PO TABS
40.0000 mg | ORAL_TABLET | Freq: Every day | ORAL | Status: DC
  Administered 2015-01-05 – 2015-01-08 (×4): 40 mg via ORAL
  Filled 2015-01-05 (×4): qty 1

## 2015-01-05 MED ORDER — POTASSIUM CHLORIDE CRYS ER 20 MEQ PO TBCR
40.0000 meq | EXTENDED_RELEASE_TABLET | Freq: Once | ORAL | Status: AC
  Administered 2015-01-05: 40 meq via ORAL
  Filled 2015-01-05: qty 2

## 2015-01-05 MED ORDER — FUROSEMIDE 10 MG/ML IJ SOLN
40.0000 mg | Freq: Every day | INTRAMUSCULAR | Status: DC
  Administered 2015-01-05 – 2015-01-06 (×2): 40 mg via INTRAVENOUS
  Filled 2015-01-05 (×2): qty 4

## 2015-01-05 MED ORDER — INFLUENZA VAC SPLIT QUAD 0.5 ML IM SUSY
0.5000 mL | PREFILLED_SYRINGE | INTRAMUSCULAR | Status: AC
Start: 2015-01-06 — End: 2015-01-06
  Administered 2015-01-06: 0.5 mL via INTRAMUSCULAR
  Filled 2015-01-05: qty 0.5

## 2015-01-05 NOTE — Progress Notes (Signed)
  Echocardiogram 2D Echocardiogram has been performed.  Cathie Beams 01/05/2015, 2:18 PM

## 2015-01-05 NOTE — Progress Notes (Signed)
Subjective:  Patient was seen and examined this morning. Patient states he is 85% better. Shortness of breath has greatly improved, but is not quite back at baseline. Patient denies chest pain, nausea or vomiting.  Objective: Vital signs in last 24 hours: Filed Vitals:   01/04/15 2100 01/04/15 2146 01/04/15 2217 01/05/15 0509  BP: 152/96 155/103 155/94 120/79  Pulse: 106 110 102 97  Temp:  97.9 F (36.6 C)  98.2 F (36.8 C)  TempSrc:  Oral  Oral  Resp: 27 22  22   Height:  5\' 11"  (1.803 m)    Weight:  167 lb 3.2 oz (75.841 kg)  162 lb 11.2 oz (73.8 kg)  SpO2: 96% 95%  96%   Weight change:   Intake/Output Summary (Last 24 hours) at 01/05/15 1234 Last data filed at 01/05/15 0940  Gross per 24 hour  Intake    710 ml  Output   2275 ml  Net  -1565 ml   General: Vital signs reviewed.  Patient is well-developed and well-nourished, in no acute distress and cooperative with exam.  Cardiovascular: Tachycardic, regular rhythm Pulmonary/Chest: Crackles in lower lung fields bilaterally, worst on left. No wheezing or rhonchi. Good air movement, no respiratory distress. Abdominal: Soft, non-tender, non-distended, BS + Extremities: No lower extremity edema bilaterally Neurological: A&O x3 Skin: Warm, dry and intact. No rashes or erythema. Psychiatric: Normal mood and affect.   Lab Results: Basic Metabolic Panel:  Recent Labs Lab 01/04/15 1110 01/05/15 0359  NA 142 142  K 3.1* 3.1*  CL 105 106  CO2 25 27  GLUCOSE 122* 148*  BUN 12 10  CREATININE 1.01 1.07  CALCIUM 8.9 9.0  MG 1.6 2.0   Liver Function Tests:  Recent Labs Lab 01/04/15 1110  AST 18  ALT 8  ALKPHOS 59  BILITOT 0.7  PROT 7.8  ALBUMIN 3.6   CBC:  Recent Labs Lab 01/04/15 1110  WBC 10.0  NEUTROABS 7.4  HGB 11.9*  HCT 36.6*  MCV 79.9  PLT 234   Cardiac Enzymes:  Recent Labs Lab 01/04/15 2305 01/05/15 0359  TROPONINI 0.04* 0.05*   D-Dimer:  Recent Labs Lab 01/04/15 1316  DDIMER  1.04*   CBG:  Recent Labs Lab 01/05/15 0028 01/05/15 0718 01/05/15 1150  GLUCAP 130* 125* 160*   Fasting Lipid Panel:  Recent Labs Lab 01/04/15 2305  CHOL 154  HDL 44  LDLCALC 90  TRIG 102  CHOLHDL 3.5   Thyroid Function Tests:  Recent Labs Lab 01/04/15 2305  TSH 3.353   Studies/Results: Dg Chest 2 View  01/04/2015   CLINICAL DATA:  Shortness of breath. Abdominal pain and bloating since Thursday. Hypertension and diabetes.  EXAM: CHEST  2 VIEW  COMPARISON:  09/27/2013  FINDINGS: Midline trachea. Normal heart size and mediastinal contours. Trace bilateral pleural fluid. No pneumothorax. mild interstitial thickening, lower lobe predominant. Minimal left base subsegmental atelectasis.  IMPRESSION: Interstitial thickening which is new and suspicious for mild pulmonary venous congestion. Trace bilateral pleural effusions.   Electronically Signed   By: Jeronimo Greaves M.D.   On: 01/04/2015 13:00   Ct Angio Chest Pe W/cm &/or Wo Cm  01/04/2015   CLINICAL DATA:  59 year old male with shortness of breath for 3 days.  EXAM: CT ANGIOGRAPHY CHEST WITH CONTRAST  TECHNIQUE: Multidetector CT imaging of the chest was performed using the standard protocol during bolus administration of intravenous contrast. Multiplanar CT image reconstructions and MIPs were obtained to evaluate the vascular anatomy.  CONTRAST:  90mL OMNIPAQUE IOHEXOL 350 MG/ML SOLN  COMPARISON:  01/04/2015 and prior chest radiographs. 01/11/2013 abdominal CT.  FINDINGS: This is a technically satisfactory study.  Mediastinum/Nodes: No pulmonary emboli are identified.  There is no evidence of thoracic aortic aneurysm.  Cardiomegaly and moderate coronary artery calcifications are noted. A small infarct at the left ventricular apex is noted. No enlarged lymph nodes are identified.  Lungs/Pleura: Moderate bilateral airspace/ central ground-glass opacities are noted likely representing pulmonary edema. Moderate bibasilar atelectasis is noted.   Patchy right upper lobe airspace/ground-glass opacities likely represent focal edema.  Bilateral pleural effusions, small to moderate on the right and small on the left, noted.  Upper abdomen: Unremarkable  Musculoskeletal: Unremarkable  Review of the MIP images confirms the above findings.  IMPRESSION: Moderate bilateral central airspace/ ground-glass opacities likely representing moderate pulmonary edema. Bilateral pleural effusions, small to moderate on the right and small on the left. Moderate bibasilar atelectasis.  No evidence of pulmonary emboli or thoracic aortic aneurysm.  Cardiomegaly and coronary artery disease. Small left ventricular apical infarct.   Electronically Signed   By: Harmon Pier M.D.   On: 01/04/2015 16:27   Medications:  I have reviewed the patient's current medications. Prior to Admission:  Prescriptions prior to admission  Medication Sig Dispense Refill Last Dose  . glipiZIDE (GLUCOTROL) 10 MG tablet Take 10 mg by mouth 2 (two) times daily before a meal.   01/04/2015 at Unknown time  . lisinopril (PRINIVIL,ZESTRIL) 40 MG tablet Take 40 mg by mouth daily.   01/04/2015 at Unknown time  . lisinopril-hydrochlorothiazide (PRINZIDE,ZESTORETIC) 10-12.5 MG per tablet Take 1 tablet by mouth 2 (two) times daily.   01/04/2015 at Unknown time  . lovastatin (MEVACOR) 20 MG tablet Take 20 mg by mouth at bedtime.   01/04/2015 at Unknown time  . metFORMIN (GLUCOPHAGE) 1000 MG tablet Take 1,000 mg by mouth 2 (two) times daily with a meal.   01/03/2015 at Unknown time  . chlorthalidone (HYGROTON) 25 MG tablet Take 25 mg by mouth daily.   09/26/2013 at Unknown time  . Liraglutide (VICTOZA [place]) Inject 1.2 mLs into the skin daily.   09/26/2013 at Unknown time   Scheduled Meds: . aspirin EC  81 mg Oral Daily  . enoxaparin (LOVENOX) injection  40 mg Subcutaneous Daily  . furosemide  40 mg Intravenous Daily  . insulin aspart  0-9 Units Subcutaneous TID WC  . lisinopril  40 mg Oral Daily  . pantoprazole   40 mg Oral Daily  . pravastatin  20 mg Oral q1800  . sodium chloride  3 mL Intravenous Q12H   Continuous Infusions:  PRN Meds:.acetaminophen **OR** acetaminophen Assessment/Plan: Principal Problem:   Pulmonary edema Active Problems:   Uncontrolled hypertension   DM (diabetes mellitus), type 2, uncontrolled   GERD (gastroesophageal reflux disease)   Hypokalemia   Normocytic anemia   SOB (shortness of breath)  Acute Resp Failure 2/2 to Pulmonary Edema: Greatly improved. Likely secondary to new onset CHF. CXR suspicious for mild congestion. CT showed moderate airspace effusion/ground glass opacity, suggestive of moderate pulmonary edema. CT also revealed cardiomegaly, CAD, and small remote apical infarct. Patient received lasix 40 mg IV once overnight with good urinary output about 1.5 liters.Weight 170>162 overnight. Troponins trending 0.04>0.05. TSH normal at 3.353. Cholesterol well controlled: total 154, TG 102, HDL 44, LDL 90.  -Lasix 40 mg IV once -Strict I/O's -Daily weights  -Echocardiogram -Continue to trend troponins -HIV pending -HgbA1c pending -ASA 81 mg daily -Pravastatin 20 mg  daily -Cardiology consult based on echocardiogram results  HTN: 120/79 this morning. Patient is on lisinopril 40 mg daily at home. He was previously on Chlorthalidone but was taken off chlorthalidone by PCP for unknown reasons. Patient reports that his BP has been high since then in 160/90's.  -Lisinopril 40 mg daily -Lasix 40 mg IV once -Consider BB and diuretic based on echo results  Hypokalemia: Potassium 3.1 this morning. Likely secondary to Diuretics.  -KDur 40 mEq once -Repeat BMET at 1700 -Repeat BMET tomorrow am  T2DM: Unaware of HgbA1c as not in system, but patient self-reported it was >13. Patient is on Victoza, Glipizide  daily, and Metformin at home. Glucose has been well controlled. -SSI-S -CBG AC/HS -Hgba1c pending -Diet HH/Carb Mod  GERD: History of GERD and on  omeprazole at home. -Protonix 40 mg daily  HLD: On pravastatin 20 mg daily at home. -Continue pravastatin 20 mg daily  Full Code  Diet: HH/Carb Mod  DVT/PE ppx: Lovenox SQ QD  Dispo: Disposition is deferred at this time, awaiting improvement of current medical problems.  Anticipated discharge in approximately 1-2 day(s).   The patient does have a current PCP (Provider Not In System) and does need an [facility] hospital follow-up appointment after discharge.  The patient does not have transportation limitations that hinder transportation to clinic appointments.  .Services Needed at time of discharge: Y = Yes, Blank = No PT:   OT:   RN:   Equipment:   Other:     LOS: 1 day   Jill Alexanders, DO PGY-1 Internal Medicine Resident Pager # [phone] 01/05/2015 12:34 PM

## 2015-01-06 ENCOUNTER — Encounter (HOSPITAL_COMMUNITY): Payer: Self-pay | Admitting: Cardiology

## 2015-01-06 DIAGNOSIS — R7989 Other specified abnormal findings of blood chemistry: Secondary | ICD-10-CM

## 2015-01-06 DIAGNOSIS — I2510 Atherosclerotic heart disease of native coronary artery without angina pectoris: Secondary | ICD-10-CM | POA: Diagnosis present

## 2015-01-06 DIAGNOSIS — E1165 Type 2 diabetes mellitus with hyperglycemia: Secondary | ICD-10-CM

## 2015-01-06 DIAGNOSIS — R0602 Shortness of breath: Secondary | ICD-10-CM

## 2015-01-06 DIAGNOSIS — I5042 Chronic combined systolic (congestive) and diastolic (congestive) heart failure: Secondary | ICD-10-CM | POA: Clinically undetermined

## 2015-01-06 LAB — CBC
HCT: 35 % — ABNORMAL LOW (ref 39.0–52.0)
HEMOGLOBIN: 11.3 g/dL — AB (ref 13.0–17.0)
MCH: 25.8 pg — ABNORMAL LOW (ref 26.0–34.0)
MCHC: 32.3 g/dL (ref 30.0–36.0)
MCV: 79.9 fL (ref 78.0–100.0)
Platelets: 213 10*3/uL (ref 150–400)
RBC: 4.38 MIL/uL (ref 4.22–5.81)
RDW: 15.7 % — ABNORMAL HIGH (ref 11.5–15.5)
WBC: 9.1 10*3/uL (ref 4.0–10.5)

## 2015-01-06 LAB — GLUCOSE, CAPILLARY
GLUCOSE-CAPILLARY: 171 mg/dL — AB (ref 70–99)
GLUCOSE-CAPILLARY: 226 mg/dL — AB (ref 70–99)
Glucose-Capillary: 154 mg/dL — ABNORMAL HIGH (ref 70–99)
Glucose-Capillary: 203 mg/dL — ABNORMAL HIGH (ref 70–99)

## 2015-01-06 LAB — BASIC METABOLIC PANEL
Anion gap: 7 (ref 5–15)
BUN: 15 mg/dL (ref 6–23)
CALCIUM: 8.8 mg/dL (ref 8.4–10.5)
CHLORIDE: 109 mmol/L (ref 96–112)
CO2: 24 mmol/L (ref 19–32)
CREATININE: 1.02 mg/dL (ref 0.50–1.35)
GFR calc Af Amer: >90 mL/min (ref >90)
GFR calc non Af Amer: 78 mL/min — ABNORMAL LOW (ref >90)
GLUCOSE: 135 mg/dL — AB (ref 70–99)
Potassium: 3.3 mmol/L — ABNORMAL LOW (ref 3.5–5.1)
Sodium: 140 mmol/L (ref 135–145)

## 2015-01-06 LAB — HEMOGLOBIN A1C
HEMOGLOBIN A1C: 11 % — AB (ref 4.8–5.6)
Mean Plasma Glucose: 269 mg/dL

## 2015-01-06 LAB — HIV ANTIBODY (ROUTINE TESTING W REFLEX): HIV SCREEN 4TH GENERATION: NONREACTIVE

## 2015-01-06 MED ORDER — SODIUM CHLORIDE 0.9 % IV SOLN: 250.0000 mL | INTRAVENOUS | Status: DC | PRN

## 2015-01-06 MED ORDER — CARVEDILOL 3.125 MG PO TABS
3.1250 mg | ORAL_TABLET | Freq: Two times a day (BID) | ORAL | Status: DC
  Administered 2015-01-06: 3.125 mg via ORAL
  Filled 2015-01-06 (×4): qty 1

## 2015-01-06 MED ORDER — PANTOPRAZOLE SODIUM 40 MG PO TBEC
40.0000 mg | DELAYED_RELEASE_TABLET | Freq: Two times a day (BID) | ORAL | Status: DC
  Administered 2015-01-06 – 2015-01-08 (×5): 40 mg via ORAL
  Filled 2015-01-06 (×5): qty 1

## 2015-01-06 MED ORDER — FUROSEMIDE 10 MG/ML IJ SOLN
40.0000 mg | Freq: Two times a day (BID) | INTRAMUSCULAR | Status: DC
  Administered 2015-01-07: 40 mg via INTRAVENOUS
  Filled 2015-01-06 (×3): qty 4

## 2015-01-06 MED ORDER — SODIUM CHLORIDE 0.9 % IJ SOLN
3.0000 mL | Freq: Two times a day (BID) | INTRAMUSCULAR | Status: DC
  Administered 2015-01-07 (×2): 3 mL via INTRAVENOUS

## 2015-01-06 MED ORDER — SODIUM CHLORIDE 0.9 % IJ SOLN: 3.0000 mL | INTRAMUSCULAR | Status: DC | PRN

## 2015-01-06 MED ORDER — SPIRONOLACTONE 25 MG PO TABS
25.0000 mg | ORAL_TABLET | Freq: Every day | ORAL | Status: DC
  Administered 2015-01-06 – 2015-01-07 (×2): 25 mg via ORAL
  Filled 2015-01-06 (×2): qty 1

## 2015-01-06 MED ORDER — SODIUM CHLORIDE 0.9 % IV SOLN
1.0000 mL/kg/h | INTRAVENOUS | Status: DC
  Administered 2015-01-07: 1 mL/kg/h via INTRAVENOUS

## 2015-01-06 NOTE — Progress Notes (Signed)
Subjective:  Patient was seen and examined this morning. Patient states shortness of breath has greatly improved, but still feels at about 85% of his baseline. He is able to almost lay flat at bedtime-bed was at 15 degrees. He denies chest pain, cough or leg swelling.   Objective: Vital signs in last 24 hours: Filed Vitals:   01/05/15 1554 01/05/15 1949 01/06/15 0545 01/06/15 0953  BP: 147/99 143/98 145/94 161/98  Pulse: 100 102 98 113  Temp: 98.1 F (36.7 C) 98.4 F (36.9 C) 98.4 F (36.9 C)   TempSrc: Oral Oral Oral   Resp:  18 20   Height:      Weight:   158 lb 12.8 oz (72.031 kg)   SpO2: 98% 98% 96%    Weight change: -11 lb 3.2 oz (-5.08 kg)  Intake/Output Summary (Last 24 hours) at 01/06/15 1253 Last data filed at 01/06/15 1029  Gross per 24 hour  Intake   1565 ml  Output   1650 ml  Net    -85 ml   General: Vital signs reviewed.  Patient is well-developed and well-nourished, in no acute distress and cooperative with exam.  Cardiovascular: Tachycardic, regular rhythm. + JVD. Pulmonary/Chest: Mild crackles in lower lung fields on left. No wheezing or rhonchi. Good air movement, no respiratory distress. Abdominal: Soft, non-tender, non-distended, BS + Extremities: No lower extremity edema bilaterally Neurological: A&O x3 Skin: Warm, dry and intact. No rashes or erythema. Psychiatric: Normal mood and affect.   Lab Results: Basic Metabolic Panel:  Recent Labs Lab 01/04/15 1110 01/05/15 0359 01/05/15 1710 01/06/15 0538  NA 142 142 141 140  K 3.1* 3.1* 3.3* 3.3*  CL 105 106 105 109  CO2 GLUCOSE 122* 148* 165* 135*  BUN CREATININE 1.01 1.07 1.23 1.02  CALCIUM 8.9 9.0 9.3 8.8  MG 1.6 2.0  --   --    Liver Function Tests:  Recent Labs Lab 01/04/15 1110  AST 18  ALT 8  ALKPHOS 59  BILITOT 0.7  PROT 7.8  ALBUMIN 3.6   CBC:  Recent Labs Lab 01/04/15 1110 01/06/15 0538  WBC 10.0 9.1  NEUTROABS 7.4  --   HGB 11.9* 11.3*    HCT 36.6* 35.0*  MCV 79.9 79.9  PLT 234 213   Cardiac Enzymes:  Recent Labs Lab 01/04/15 2305 01/05/15 0359 01/05/15 1140  TROPONINI 0.04* 0.05* 0.04*   D-Dimer:  Recent Labs Lab 01/04/15 1316  DDIMER 1.04*   CBG:  Recent Labs Lab 01/05/15 0718 01/05/15 1150 01/05/15 1552 01/05/15 2107 01/06/15 0653 01/06/15 1117  GLUCAP 125* 160* 157* 187* 154* 203*   Fasting Lipid Panel:  Recent Labs Lab 01/04/15 2305  CHOL 154  HDL 44  LDLCALC 90  TRIG 102  CHOLHDL 3.5   Thyroid Function Tests:  Recent Labs Lab 01/04/15 2305  TSH 3.353   Studies/Results: Ct Angio Chest Pe W/cm &/or Wo Cm  01/04/2015   CLINICAL DATA:  59 year old male with shortness of breath for 3 days.  EXAM: CT ANGIOGRAPHY CHEST WITH CONTRAST  TECHNIQUE: Multidetector CT imaging of the chest was performed using the standard protocol during bolus administration of intravenous contrast. Multiplanar CT image reconstructions and MIPs were obtained to evaluate the vascular anatomy.  CONTRAST:  90mL OMNIPAQUE IOHEXOL 350 MG/ML SOLN  COMPARISON:  01/04/2015 and prior chest radiographs. 01/11/2013 abdominal CT.  FINDINGS: This is a technically satisfactory study.  Mediastinum/Nodes: No pulmonary emboli are identified.  There is no evidence of thoracic aortic aneurysm.  Cardiomegaly and moderate coronary artery calcifications are noted. A small infarct at the left ventricular apex is noted. No enlarged lymph nodes are identified.  Lungs/Pleura: Moderate bilateral airspace/ central ground-glass opacities are noted likely representing pulmonary edema. Moderate bibasilar atelectasis is noted.  Patchy right upper lobe airspace/ground-glass opacities likely represent focal edema.  Bilateral pleural effusions, small to moderate on the right and small on the left, noted.  Upper abdomen: Unremarkable  Musculoskeletal: Unremarkable  Review of the MIP images confirms the above findings.  IMPRESSION: Moderate bilateral central  airspace/ ground-glass opacities likely representing moderate pulmonary edema. Bilateral pleural effusions, small to moderate on the right and small on the left. Moderate bibasilar atelectasis.  No evidence of pulmonary emboli or thoracic aortic aneurysm.  Cardiomegaly and coronary artery disease. Small left ventricular apical infarct.   Electronically Signed   By: Harmon Pier M.D.   On: 01/04/2015 16:27   Medications:  I have reviewed the patient's current medications. Prior to Admission:  Prescriptions prior to admission  Medication Sig Dispense Refill Last Dose  . glipiZIDE (GLUCOTROL) 10 MG tablet Take 10 mg by mouth 2 (two) times daily before a meal.   01/04/2015 at Unknown time  . lisinopril (PRINIVIL,ZESTRIL) 40 MG tablet Take 40 mg by mouth daily.   01/04/2015 at Unknown time  . lisinopril-hydrochlorothiazide (PRINZIDE,ZESTORETIC) 10-12.5 MG per tablet Take 1 tablet by mouth 2 (two) times daily.   01/04/2015 at Unknown time  . lovastatin (MEVACOR) 20 MG tablet Take 20 mg by mouth at bedtime.   01/04/2015 at Unknown time  . metFORMIN (GLUCOPHAGE) 1000 MG tablet Take 1,000 mg by mouth 2 (two) times daily with a meal.   01/03/2015 at Unknown time  . chlorthalidone (HYGROTON) 25 MG tablet Take 25 mg by mouth daily.   09/26/2013 at Unknown time  . Liraglutide (VICTOZA [place]) Inject 1.2 mLs into the skin daily.   09/26/2013 at Unknown time   Scheduled Meds: . aspirin EC  81 mg Oral Daily  . carvedilol  3.125 mg Oral BID WC  . enoxaparin (LOVENOX) injection  40 mg Subcutaneous Daily  . furosemide  40 mg Intravenous Daily  . insulin aspart  0-9 Units Subcutaneous TID WC  . lisinopril  40 mg Oral Daily  . pantoprazole  40 mg Oral Daily  . potassium chloride  40 mEq Oral BID  . pravastatin  20 mg Oral q1800  . 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 Echo revealed severe global reduction in LV function with EF of 20-25%, grade 2 diastolic dysfunction with elevated LV filling pressure, moderate MR, small pericardial effusion and left pleural effusion. Patient was seen by cardiology who recommended increasing diuresis to BID dosing, adding spironolactone, adding carvedilol, and proceeding with a cardiac catheterization in the morning to evaluate CAD. They do not recommend LifeVest at this time as patient  does not have a true arrhythmia. They do recommend rechecking echo in 3-4 months. Down 12 pounds this admission. -Lasix 40 mg IV BID (hold morning dose for procedure) -Spironolactone 25 mg daily -Carvedilol 3.125 mg BID -Strict I/O's -Daily weights  -ASA 81 mg daily -Pravastatin 20 mg daily -Follow up echo in 3-4 months -Catheterization in am -NPO at midnight  HTN: 145//94 this morning. Patient is on lisinopril 40 mg daily at home. Cardiology recommends adding carvedilol and spironolactone.  -Carvedilol 3.125 mg BID -Spironolactone 25 mg daily  -Lisinopril 40 mg daily -Lasix 40 mg IV BID  Hypokalemia: Potassium 3.3 this morning in the setting of diuresis. -Added Spironolactone 25 mg daily -KDur 40 mEq BID -Repeat BMET tomorrow am  T2DM: HgbA1c of 11.0. Patient is on Victoza, Glipizide  daily, and Metformin at home. Patient will likely need to be started on insulin regimen for home as hgba1c is not well controlled on home regimen. Glucose has been 130-200s. -SSI-S -CBG  AC/HS -Diet HH/Carb Mod  GERD: History of GERD and on omeprazole at home. -Protonix 40 mg daily  HLD: On pravastatin 20 mg daily at home. LDL is 90. Based on cath results, may need to place patient on high intensity statin. -Continue pravastatin 20 mg daily  Full Code  Diet: HH/Carb Mod  DVT/PE ppx: Lovenox SQ QD  Dispo: Disposition is deferred at this time, awaiting improvement of current medical problems.  Anticipated discharge in approximately 1-2 day(s).   The patient does have a current PCP (Provider Not In System) and does need an [facility] hospital follow-up appointment after discharge.  The patient does not have transportation limitations that hinder transportation to clinic appointments.  .Services Needed at time of discharge: Y = Yes, Blank = No PT:   OT:   RN:   Equipment:   Other:     LOS: 2 days   Jill Alexanders, DO PGY-1 Internal Medicine Resident Pager # [phone] 01/06/2015 12:53 PM

## 2015-01-06 NOTE — Progress Notes (Signed)
Utilization review completed. Jovin Fester, RN, BSN. 

## 2015-01-06 NOTE — Consult Note (Signed)
CARDIOLOGY CONSULTATION NOTE.  NAME:  Jerry Bean   MRN: [id number] DOB:  May 20, 1956   ADMIT DATE: 01/04/2015  Reason for Consult: New diagnosis CHF  Requesting Physician: Earl Lagos, MD  Primary Cardiologist: New to    HPI: This is a 59 y.o. male with a past medical history significant for HTN (reportedly uncontrolled), DM-2 (uncontrolled), & GERD who was admitted by the IM Teaching Service on 01/04/15 with c/o increasing SOB x ~3 days - orthopnea & DOE (unable to lie flat  W/ 4 pillow orthopnea & DOE within ~1-2 blocks of simple ambualtion).  Got progressively worse & PCP recommended that he go to the ER. He did note chest tightness / pressure only with lying down, not with exertion -- suggesting related to orhtopnea & potentially angina decubitus.  PE protocol CTA was performed in ER -see results below, no PE, + pleural effusions & pulmonary edema, + coronary Calcification (reported old apical infarct - not sure that this can accurately be assessed on CT). Mild, flat Troponin elevation ~0.04 was noted - most c/w LV strain in setting of CHF.  EKG reveals mostly LVH.   2 D Echo yesterday indicated severely reduced LVEF 20-25% (noted in [facility]). He has been treated with IV Furosemide  IV daily & diuresed 1.75 L on day 1, but was ~ net even yesterday.  Yesterday, he indicated that he "felt much better" -- ~85%.   We are consulted for new Dx of CHF.  He is already on high dose ACE-I in addition to IV Lasix.  Appropriately was not started on BB in setting of acute exacerbation - tachycardia is compensatory.  Past Medical History  Diagnosis Date  . Diabetes mellitus type 2, uncontrolled   . Essential hypertension   . GERD (gastroesophageal reflux disease)    Past Surgical History  Procedure Laterality Date  . Transthoracic echocardiogram  01/05/2015    EF 20-25%, diffuse Hypokinesis, Grade 2 DD, mild Ao root dilation, Mod MR, Severe LA dilation, Trivial AI   FAMHx: Family History    Problem Relation Age of Onset  . Diabetes Mother   . AAA (abdominal aortic aneurysm) Brother 32    died of ruptured AAA    SOCHx:  Works as a Electrical engineer for [employer].  Married.  reports that he has never smoked. He does not have any smokeless tobacco history on file. He reports that he does not drink alcohol or use illicit drugs.  ALLERGIES: No Known Allergies  ROS: A comprehensive review of systems was performed:  Review of Systems  Constitutional: Negative for fever, chills, malaise/fatigue and diaphoresis.       NO recent illnesses.  HENT: Negative for congestion and nosebleeds.   Respiratory: Positive for shortness of breath (Not at rest -- only exertional & Orhtopnea). Negative for cough, hemoptysis and wheezing.   Cardiovascular: Positive for orthopnea and leg swelling (started noting ~2-3 months ago - has been stable with support hose & feet elevation.).  Gastrointestinal: Negative for blood in stool and melena.  Genitourinary: Negative for hematuria.  Musculoskeletal: Negative.   Neurological: Negative.  Negative for weakness and headaches.  Endo/Heme/Allergies: Negative.   Psychiatric/Behavioral: Negative.   All other systems reviewed and are negative.   HOME MEDICATIONS: Prescriptions prior to admission  Medication Sig Dispense Refill Last Dose  . glipiZIDE (GLUCOTROL) 10 MG tablet Take 10 mg by mouth 2 (two) times daily before a meal.   01/04/2015 at Unknown time  . lisinopril (PRINIVIL,ZESTRIL) 40  MG tablet Take 40 mg by mouth daily.   01/04/2015 at Unknown time  . lisinopril-hydrochlorothiazide (PRINZIDE,ZESTORETIC) 10-12.5 MG per tablet Take 1 tablet by mouth 2 (two) times daily.   01/04/2015 at Unknown time  . lovastatin (MEVACOR) 20 MG tablet Take 20 mg by mouth at bedtime.   01/04/2015 at Unknown time  . metFORMIN (GLUCOPHAGE) 1000 MG tablet Take 1,000 mg by mouth 2 (two) times daily with a meal.   01/03/2015 at Unknown time  . chlorthalidone  (HYGROTON) 25 MG tablet Take 25 mg by mouth daily.   09/26/2013 at Unknown time  . Liraglutide (VICTOZA [place]) Inject 1.2 mLs into the skin daily.   09/26/2013 at Unknown time    HOSPITAL MEDICATIONS: Scheduled Meds: . aspirin EC  81 mg Oral Daily  . enoxaparin (LOVENOX) injection  40 mg Subcutaneous Daily  . furosemide  40 mg Intravenous Daily  . Influenza vac split quadrivalent PF  0.5 mL Intramuscular Tomorrow-1000  . insulin aspart  0-9 Units Subcutaneous TID WC  . lisinopril  40 mg Oral Daily  . pantoprazole  40 mg Oral Daily  . pneumococcal 23 valent vaccine  0.5 mL Intramuscular Tomorrow-1000  . potassium chloride  40 mEq Oral BID  . pravastatin  20 mg Oral q1800  . sodium chloride  3 mL Intravenous Q12H   Continuous Infusions:  PRN Meds:.acetaminophen **OR** acetaminophen  VITALS: Blood pressure 161/98, pulse 113, temperature 98.4 F (36.9 C), temperature source Oral, resp. rate 20, height  (1.803 m), weight 158 lb 12.8 oz (72.031 kg), SpO2 96 %.  PHYSICAL EXAM: Physical Exam  Constitutional: He is oriented to person, place, and time. He appears well-developed and well-nourished.  HENT:  Head: Normocephalic.  Nose: Nose normal.  Mouth/Throat: Oropharynx is clear and moist.  Eyes: Conjunctivae are normal. Pupils are equal, round, and reactive to light. Right eye exhibits no discharge. Left eye exhibits no discharge.  Neck: Normal range of motion. Neck supple. Hepatojugular reflux and JVD (~12 cmH20) present. Normal carotid pulses present. Carotid bruit is not present. No thyromegaly present.  Cardiovascular: Regular rhythm.   No extrasystoles are present. Tachycardia present.  PMI is not displaced.  Exam reveals gallop and S4. Exam reveals no distant heart sounds and no midsystolic click.   No murmur heard. Pulmonary/Chest: Effort normal and breath sounds normal. No respiratory distress. He has no wheezes. He has no rales. He exhibits no tenderness.  Abdominal: Soft.  Bowel sounds are normal. He exhibits no distension and no mass. There is no tenderness. There is no rebound and no guarding.  Genitourinary:  deferred  Musculoskeletal: Normal range of motion. He exhibits no edema or tenderness.  Neurological: He is alert and oriented to person, place, and time. No cranial nerve deficit. Coordination normal.  Skin: Skin is warm. No rash noted. No erythema. No pallor.  Psychiatric: He has a normal mood and affect. His behavior is normal. Judgment and thought content normal.    LABS: Results for orders placed or performed during the hospital encounter of 01/04/15 (from the past 24 hour(s))  Troponin I     Status: Abnormal   Collection Time: 01/05/15 11:40 AM  Result Value Ref Range   Troponin I 0.04 (H) <0.031 ng/mL  Glucose, capillary     Status: Abnormal   Collection Time: 01/05/15  9:07 PM  Result Value Ref Range   Glucose-Capillary 187 (H) 70 - 99 mg/dL  CBC     Status: Abnormal   Collection Time: 01/06/15  5:38 AM  Result Value Ref Range   WBC 9.1 4.0 - 10.5 K/uL   RBC 4.38 4.22 - 5.81 MIL/uL   Hemoglobin 11.3 (L) 13.0 - 17.0 g/dL   HCT 40.3 (L) 52.4 - 81.8 %   MCV 79.9 78.0 - 100.0 fL   MCH 25.8 (L) 26.0 - 34.0 pg   MCHC 32.3 30.0 - 36.0 g/dL   RDW 59.0 (H) 93.1 - 12.1 %   Platelets 213 150 - 400 K/uL  Basic metabolic panel     Status: Abnormal   Collection Time: 01/06/15  5:38 AM  Result Value Ref Range   Sodium 140 135 - 145 mmol/L   Potassium 3.3 (L) 3.5 - 5.1 mmol/L   Chloride 109 96 - 112 mmol/L   CO2 24 19 - 32 mmol/L   Glucose, Bld 135 (H) 70 - 99 mg/dL   BUN 15 6 - 23 mg/dL   Creatinine, Ser 6.24 0.50 - 1.35 mg/dL   Calcium 8.8 8.4 - 46.9 mg/dL   GFR calc non Af Amer 78 (L) >90 mL/min   GFR calc Af Amer >90 >90 mL/min   Anion gap 7 5 - 15  Glucose, capillary     Status: Abnormal   Collection Time: 01/06/15  6:53 AM  Result Value Ref Range   Glucose-Capillary 154 (H) 70 - 99 mg/dL    IMAGING:  CT Angio of Chest:    Moderate bilateral central airspace/ ground-glass opacities likely representing moderate pulmonary edema. Bilateral pleural effusions, small to moderate on the right and small on the left. Moderate bibasilar atelectasis.  No evidence of pulmonary emboli or thoracic aortic aneurysm.  Cardiomegaly and coronacoronary artery disease. ? Small left ventricular apical infarct.  EKG:  01/04/2015: Rate:114 , Rhythm: S Tachycardia;  LVH with mild repolarization changes.  Poor initial R waves in precordial leads (but does not meet criteria for MI, age undetermined)  IMPRESSION: Principal Problem:   Acute combined systolic and diastolic heart failure - New Dx.  EF 20-25% Active Problems:   Pulmonary edema   Uncontrolled hypertension   SOB (shortness of breath)   Elevated troponin - in setting of Acute CHF    Coronary artery calcification seen on CAT scan   DM (diabetes mellitus), type 2, uncontrolled   GERD (gastroesophageal reflux disease)   Hypokalemia   Normocytic anemia  RECOMMENDATION: 1. Acute combined systolic and diastolic heart failure - New Dx.  EF 20-25%  IV diuresis - would increase to BID (but hold AM dose pre-cath)  Will consult CHF Teaching RN.    Add Spironolactone 25 mg daily (will help with K+ levels)  Still not quite Euvolemic, will add Carvedilol (will start this PM)  With Cardiac RFs of DM-2 & HTN with age along with Coronary Artery Calcification on CT, would recommend R&LHC to confirm or deny presence of severe MV CAD (i.e. Ischemic vs. Non-ischemic CM) despite his denial of anginal symptoms.  In a diabetic, cannot exclude "silent" ischemia. Risks / Complications include, but not limited to: Death, MI, CVA/TIA, VF/VT (with defibrillation), Bradycardia (need for temporary pacer placement), contrast induced nephropathy, bleeding / bruising / hematoma / pseudoaneurysm, vascular or coronary injury (with possible emergent CT or Vascular Surgery), adverse medication  reactions, infection.  Additional risks involving the use of radiation with the possibility of radiation burns and cancer were explained in detail. The patient and wife voice understanding and agree to proceed.   Orders written Further recommendations based upon resultst  I discussed the  consideration of LifeVest with Dr. Gala Romney - in the absence of any true arrhythmia on Tele, would most likely not recommend coverage.  Would recheck Echo in ~3-4 months with optimized med Rx & determine future plans for+/- ICD. 2. Uncontrolled HTN  Anticipate starting Carvedilol once euvolemic, but for now would use Hydralazine/Nitrate combination for additional afterload reduction. 3. Elevated Troponin level - in setting of acute CHF, low levels are most c/w LV strain.  Not NSTEMI or demand ischemia   Assessing CAD by cath  + chest tightness with lying down -> ? Angina decubitus 4. Coronary Artery Calcification on CT: clearly indicated CAD   Is on statin: LDL is 90 -- will await results of Cath to determine if goal is < 70 or < 100.  Continue current dose for now. 5. Hypokalemia: Monitor Potassium (K+) levels with IV Diuresis. 6. Family H/o AAA - will need Abd Ao [place] as OP.   Time Spent Directly with Patient: 45 minutes  Sandeep Radell, Piedad Climes, M.D., M.S. Interventional Cardiologist   Pager # [phone]

## 2015-01-06 NOTE — Progress Notes (Addendum)
Heart Failure Navigator Consult Note  Presentation: Jerry Bean is a 59 y/o male with PMH of DM, HTN, GERD who p/w worsening SOB, orthopnea for 3 days. Patient states that he normally sleeps on 3-4 pillows but has noted worsening SOB over the past 3 days and has had to sleep almost sitting upright. He also c/o associated DOE. He also has an assoc dry cough. No fevers/chills, no CP, no palpitations, no diaphoresis no abd pain, no n/v, no HA, no syncope.   Past Medical History  Diagnosis Date  . Diabetes mellitus type 2, uncontrolled   . Essential hypertension   . GERD (gastroesophageal reflux disease)     History   Social History  . Marital Status: Married    Spouse Name: N/A  . Number of Children: N/A  . Years of Education: N/A   Occupational History  . security guard    Social History Main Topics  . Smoking status: Never Smoker   . Smokeless tobacco: Not on file  . Alcohol Use: No  . Drug Use: No  . Sexual Activity: Not on file   Other Topics Concern  . None   Social History Narrative    ECHO:Study Conclusions--01/05/15  - Left ventricle: The cavity size was mildly dilated. Wall thickness was increased in a pattern of mild LVH. Systolic function was severely reduced. The estimated ejection fraction was in the range of 20% to 25%. Diffuse hypokinesis. Features are consistent with a pseudonormal left ventricular filling pattern, with concomitant abnormal relaxation and increased filling pressure (grade 2 diastolic dysfunction). Doppler parameters are consistent with high ventricular filling pressure. - Aortic valve: There was trivial regurgitation. - Aortic root: The aortic root was mildly dilated. - Mitral valve: There was moderate regurgitation. - Left atrium: The atrium was severely dilated. - Pericardium, extracardiac: A small pericardial effusion was identified. There was a left pleural effusion.  Impressions:  - Severe global reduction in LV  function; grade 2 diastolic dysfunction with elevated LV filling pressure; severe LAE; moderate MR; small pericardial effusion; left pleural effusion.  Transthoracic echocardiography. M-mode, complete 2D, spectral Doppler, and color Doppler. Birthdate: Patient birthdate: 1955-11-05. Age: Patient is 59 yr old. Sex: Gender: male. BMI: 22.5 kg/m^2. Blood pressure:   120/79 Patient status: Inpatient. Study date: Study date: 01/05/2015. Study time: 01:48 PM. Location: Bedside.  BNP    Component Value Date/Time   BNP 463.0* 01/04/2015 1110    ProBNP No results found for: PROBNP   Education Assessment and Provision:  Detailed education and instructions provided on heart failure disease management including the following:  Signs and symptoms of Heart Failure When to call the physician Importance of daily weights Low sodium diet Fluid restriction Medication management Anticipated future follow-up appointments  Patient education given on each of the above topics.  Patient acknowledges understanding and acceptance of all instructions.  I spoke at length with Mr. Laos and his wife regarding his new HF.  He does not have a scale however plans to purchase one at the time of discharge.  I explained the importance of daily weights and when to call the physician.  We reviewed a low sodium diet and high sodium foods to avoid.  He is able to teach back topics listed above and is very motivated to make appropriate changes for his health.  He is for a cardiac catheterization tomorrow and I will plan to return to reinforce education after that takes place.  I also encouraged them to call with any questions after  discharge as they are still absorbing all new information.  Education Materials:  "Living Better With Heart Failure" Booklet, Daily Weight Tracker Tool   High Risk Criteria for Readmission and/or Poor Patient Outcomes:   EF <30%- yes 20-25% with grade 2 dias dys.  2 or  more admissions in 6 months- No--New HF  Difficult social situation- No  Demonstrates medication noncompliance- No    Barriers of Care:  Knowledge and compliance. Discharge Planning:  Plans to discharge to home with wife.

## 2015-01-07 ENCOUNTER — Encounter (HOSPITAL_COMMUNITY)
Admission: EM | Disposition: A | Discharge status: Home / Self Care | Payer: Self-pay | Source: Home / Self Care | Attending: Internal Medicine

## 2015-01-07 ENCOUNTER — Encounter (HOSPITAL_COMMUNITY): Payer: Self-pay | Admitting: Cardiovascular Disease

## 2015-01-07 DIAGNOSIS — I429 Cardiomyopathy, unspecified: Secondary | ICD-10-CM

## 2015-01-07 DIAGNOSIS — I2510 Atherosclerotic heart disease of native coronary artery without angina pectoris: Secondary | ICD-10-CM

## 2015-01-07 DIAGNOSIS — I5041 Acute combined systolic (congestive) and diastolic (congestive) heart failure: Principal | ICD-10-CM

## 2015-01-07 DIAGNOSIS — I428 Other cardiomyopathies: Secondary | ICD-10-CM | POA: Insufficient documentation

## 2015-01-07 DIAGNOSIS — E876 Hypokalemia: Secondary | ICD-10-CM

## 2015-01-07 HISTORY — PX: LEFT AND RIGHT HEART CATHETERIZATION WITH CORONARY ANGIOGRAM: SHX5449

## 2015-01-07 LAB — CBC
HEMATOCRIT: 38.1 % — AB (ref 39.0–52.0)
Hemoglobin: 12.1 g/dL — ABNORMAL LOW (ref 13.0–17.0)
MCH: 26 pg (ref 26.0–34.0)
MCHC: 31.8 g/dL (ref 30.0–36.0)
MCV: 81.9 fL (ref 78.0–100.0)
PLATELETS: 265 10*3/uL (ref 150–400)
RBC: 4.65 MIL/uL (ref 4.22–5.81)
RDW: 15.7 % — ABNORMAL HIGH (ref 11.5–15.5)
WBC: 8.9 10*3/uL (ref 4.0–10.5)

## 2015-01-07 LAB — POCT I-STAT 3, VENOUS BLOOD GAS (G3P V)
ACID-BASE DEFICIT: 1 mmol/L (ref 0.0–2.0)
Bicarbonate: 23.9 mEq/L (ref 20.0–24.0)
O2 SAT: 61 %
PCO2 VEN: 41.7 mmHg — AB (ref 45.0–50.0)
TCO2: 25 mmol/L (ref 0–100)
pH, Ven: 7.367 — ABNORMAL HIGH (ref 7.250–7.300)
pO2, Ven: 33 mmHg (ref 30.0–45.0)

## 2015-01-07 LAB — POCT I-STAT 3, ART BLOOD GAS (G3+)
Acid-base deficit: 3 mmol/L — ABNORMAL HIGH (ref 0.0–2.0)
Bicarbonate: 22.2 mEq/L (ref 20.0–24.0)
O2 SAT: 94 %
PCO2 ART: 39.1 mmHg (ref 35.0–45.0)
PO2 ART: 74 mmHg — AB (ref 80.0–100.0)
TCO2: 23 mmol/L (ref 0–100)
pH, Arterial: 7.364 (ref 7.350–7.450)

## 2015-01-07 LAB — BASIC METABOLIC PANEL
Anion gap: 9 (ref 5–15)
BUN: 20 mg/dL (ref 6–23)
CHLORIDE: 107 mmol/L (ref 96–112)
CO2: 25 mmol/L (ref 19–32)
Calcium: 9.1 mg/dL (ref 8.4–10.5)
Creatinine, Ser: 1.19 mg/dL (ref 0.50–1.35)
GFR calc Af Amer: 75 mL/min — ABNORMAL LOW (ref >90)
GFR, EST NON AFRICAN AMERICAN: 65 mL/min — AB (ref >90)
GLUCOSE: 149 mg/dL — AB (ref 70–99)
POTASSIUM: 3.6 mmol/L (ref 3.5–5.1)
SODIUM: 141 mmol/L (ref 135–145)

## 2015-01-07 LAB — CREATININE, SERUM
Creatinine, Ser: 1.31 mg/dL (ref 0.50–1.35)
GFR calc non Af Amer: 58 mL/min — ABNORMAL LOW (ref >90)
GFR, EST AFRICAN AMERICAN: 67 mL/min — AB (ref >90)

## 2015-01-07 LAB — GLUCOSE, CAPILLARY
GLUCOSE-CAPILLARY: 179 mg/dL — AB (ref 70–99)
GLUCOSE-CAPILLARY: 237 mg/dL — AB (ref 70–99)
Glucose-Capillary: 154 mg/dL — ABNORMAL HIGH (ref 70–99)
Glucose-Capillary: 151 mg/dL — ABNORMAL HIGH (ref 70–99)
Glucose-Capillary: 135 mg/dL — ABNORMAL HIGH (ref 70–99)

## 2015-01-07 LAB — PROTIME-INR
INR: 1.12 (ref 0.00–1.49)
PROTHROMBIN TIME: 14.6 s (ref 11.6–15.2)

## 2015-01-07 SURGERY — LEFT AND RIGHT HEART CATHETERIZATION WITH CORONARY ANGIOGRAM
Anesthesia: LOCAL

## 2015-01-07 MED ORDER — SODIUM CHLORIDE 0.9 % IV SOLN: INTRAVENOUS | Status: DC

## 2015-01-07 MED ORDER — ACETAMINOPHEN 325 MG PO TABS: 650.0000 mg | ORAL_TABLET | ORAL | Status: DC | PRN

## 2015-01-07 MED ORDER — ASPIRIN EC 81 MG PO TBEC
81.0000 mg | DELAYED_RELEASE_TABLET | Freq: Every day | ORAL | Status: DC
  Administered 2015-01-08: 81 mg via ORAL
  Filled 2015-01-07: qty 1

## 2015-01-07 MED ORDER — ONDANSETRON HCL 4 MG/2ML IJ SOLN: 4.0000 mg | Freq: Four times a day (QID) | INTRAMUSCULAR | Status: DC | PRN

## 2015-01-07 MED ORDER — HEPARIN (PORCINE) IN NACL 2-0.9 UNIT/ML-% IJ SOLN
INTRAMUSCULAR | Status: AC
  Filled 2015-01-07: qty 1000

## 2015-01-07 MED ORDER — CARVEDILOL 6.25 MG PO TABS
6.2500 mg | ORAL_TABLET | Freq: Two times a day (BID) | ORAL | Status: DC
  Administered 2015-01-07 – 2015-01-08 (×4): 6.25 mg via ORAL
  Filled 2015-01-07 (×5): qty 1

## 2015-01-07 MED ORDER — LIDOCAINE HCL (PF) 1 % IJ SOLN
INTRAMUSCULAR | Status: AC
  Filled 2015-01-07: qty 30

## 2015-01-07 MED ORDER — SODIUM CHLORIDE 0.9 % IV SOLN
INTRAVENOUS | Status: DC
  Administered 2015-01-07: 13:00:00 via INTRAVENOUS

## 2015-01-07 MED ORDER — MIDAZOLAM HCL 2 MG/2ML IJ SOLN
INTRAMUSCULAR | Status: AC
  Filled 2015-01-07: qty 2

## 2015-01-07 MED ORDER — HEPARIN SODIUM (PORCINE) 5000 UNIT/ML IJ SOLN
5000.0000 [IU] | Freq: Three times a day (TID) | INTRAMUSCULAR | Status: DC
  Administered 2015-01-08 (×2): 5000 [IU] via SUBCUTANEOUS
  Filled 2015-01-07 (×4): qty 1

## 2015-01-07 MED ORDER — POTASSIUM CHLORIDE CRYS ER 20 MEQ PO TBCR
40.0000 meq | EXTENDED_RELEASE_TABLET | Freq: Once | ORAL | Status: AC
Start: 2015-01-07 — End: 2015-01-07
  Administered 2015-01-07: 40 meq via ORAL

## 2015-01-07 MED ORDER — FENTANYL CITRATE 0.05 MG/ML IJ SOLN
INTRAMUSCULAR | Status: AC
  Filled 2015-01-07: qty 2

## 2015-01-07 NOTE — CV Procedure (Addendum)
Jerry Bean is a 59 y.o. male   [id number]  [id number] LOCATION:  FACILITY: [facility]  PHYSICIAN: Lennette Bihari, MD, [facility] 04-27-1956   DATE OF PROCEDURE:  01/07/2015      RIGHT AND LEFT HEART CARDIAC CATHETERIZATION   HISTORY:  Jerry Bean is a 59 y.o. male with a history of diabetes mellitus, hypertension, who was recently developed increasing shortness of breath, PND orthopnea.  He was admitted with acute systolic and diastolic heart failure.  Echo Doppler study revealed an ejection fraction of 20-25%.  He is now referred for right and left heart cardiac catheterization.   PROCEDURE:  Right and left heart cardiac catheterization: Swan-Ganz catheterization, cardiac output determination by the thermodilution and Fick methods, coronary angiography, left ventriculography.  The patient was brought to the second floor [place] [facility]iac cath lab in the fasting state. Versed 2 mg and fentanyl 50 mcg were administered for conscious sedation. The right groin was prepped and draped in sterile fashion and a 5 [place] arterial sheath and 7 French venous sheath were inserted without difficulty. A Swan-Ganz catheter was advanced into the venous sheath and pressures were obtained in the right atrium, right ventricle, pulmonary artery, and pulmonary capillary wedge position. Cardiac outputs were obtained by the thermodilution and assumed Fick methods. Oxygen saturation was obtained in the pulmonary artery and aorta. A pigtail catheter was inserted and simultaneous AO/PA pressures were recorded. The pigtail catheter was advanced into the left ventricle and simultaneous left ventricular and PCW pressures were recorded. Left ventriculography was performed in the RAO projection.  A left ventricle to aorta pullback was performed. The pigtail catheter was then removed and diagnostic catheterization to delineate the coronary anatomy was performed utilizing 5 French Judkins 4 left and right diagnostic catheters. All  catheters were removed and the patient. Hemostasis was obtained by direct manual pressure. The patient tolerated the procedure well and returned to his room in satisfactory condition.   HEMODYNAMICS:   RA: 4 RV: 29/4 PA: 23/10 PC: 11  LV: 122/7/20 PC: 17 PA: 34/18  LV: 119/21 AO: 119/77  Oxygen saturation in the aorta 94% and the pulmonary artery 61%  Cardiac output: 5.7 l/min (Thermo); 5.0 (Fick)  Cardiac index: 3.0 l/m/m2                2.6  ANGIOGRAPHY:   Left main: Very large at least 6-7 mm left main which was normal and bifurcated into a large LAD and large dominant left circumflex coronary artery.  LAD: Large caliber vessel which gave rise to 4 diagonal branches and extended to and wrapped around the apex.  The proximal septal perforating artery had 70% ostial smooth narrowing.  There were minimal luminal irregularities of the LAD without obstructive disease.  Left circumflex: Large dominant vessel that gave rise to 2 major marginal branches, PDA and posterolateral branch.  There was mild luminal narrowing of 20% in the AV groove circumflex between the OM1 and known 2 vessel..   Right coronary artery: Small nondominant vessel that had smooth 30% narrowing in the mid segment after its bifurcation.  Left ventriculography  revealed a dilated left ventricle with diffuse severe  global hypocontractility with more severe hypo-to akinesis involving the distal anterolateral wall.  Ejection fraction is approximately 15 to less than 20%.  Total contrast used: 70 cc Omnipaque   IMPRESSION:  Severe nonischemic dilated cardiomyopathy with an ejection fraction of 15 to less than 20%.  No significant coronary obstructive disease with mild luminal irregularities and evidence  for 70% ostial septal perforating artery stenosis in the LAD, 20% areas of irregularity in the mid AV groove circumflex, and smooth 30-40% narrowing in the mid nondominant RCA.   RECOMMENDATION:  Aggressive  medical therapy in attempt to improve LV function.  Consider discharge with LifeVest in the interim with medication titration and additional initiation as an outpatient.  If LV function does not improve, prophylactic ICD will be necessary.   Lennette Bihari, MD, [facility] 01/07/2015 11:41 AM

## 2015-01-07 NOTE — Progress Notes (Signed)
Subjective:  Patient was seen and examined this afternoon, post-cath. Patient is doing well, laying flat in bed. He denies chest pain, shortness of breath, or dizziness. He is able to discuss what cardiology talked with him about and understands the plan.   Objective: Vital signs in last 24 hours: Filed Vitals:   01/07/15 1300 01/07/15 1315 01/07/15 1330 01/07/15 1345  BP: 133/91 137/103 136/94 135/93  Pulse: 89 89 85 89  Temp:      TempSrc:      Resp: Height:      Weight:      SpO2: 98%      Weight change: -0 oz (-0 kg)  Intake/Output Summary (Last 24 hours) at 01/07/15 1443 Last data filed at 01/07/15 1420  Gross per 24 hour  Intake 1058.93 ml  Output   1900 ml  Net -841.07 ml   General: Vital signs reviewed.  Patient is well-developed and well-nourished, in no acute distress and cooperative with exam.  Cardiovascular: Tachycardic, regular rhythm. + JVD. Pulmonary/Chest: CTA b/l no WRR. Abdominal: Soft, non-tender, non-distended, BS + Extremities: No lower extremity edema bilaterally. Sheath site is C/D/I well bandaged no signs of bleeding. Neurological: A&O x3 Skin: Warm, dry and intact. No rashes or erythema. Psychiatric: Normal mood and affect.   Lab Results: Basic Metabolic Panel:  Recent Labs Lab 01/04/15 1110 01/05/15 0359  01/06/15 0538 01/07/15 0452  NA 142 142  < > 140 141  K 3.1* 3.1*  < > 3.3* 3.6  CL 105 106  < > 109 107  CO2 25 27  < > 24 25  GLUCOSE 122* 148*  < > 135* 149*  BUN 12 10  < > 15 20  CREATININE 1.01 1.07  < > 1.02 1.19  CALCIUM 8.9 9.0  < > 8.8 9.1  MG 1.6 2.0  --   --   --   < > = values in this interval not displayed. Liver Function Tests:  Recent Labs Lab 01/04/15 1110  AST 18  ALT 8  ALKPHOS 59  BILITOT 0.7  PROT 7.8  ALBUMIN 3.6   CBC:  Recent Labs Lab 01/04/15 1110 01/06/15 0538  WBC 10.0 9.1  NEUTROABS 7.4  --   HGB 11.9* 11.3*  HCT 36.6* 35.0*  MCV 79.9 79.9  PLT 234 213   Cardiac  Enzymes:  Recent Labs Lab 01/04/15 2305 01/05/15 0359 01/05/15 1140  TROPONINI 0.04* 0.05* 0.04*   D-Dimer:  Recent Labs Lab 01/04/15 1316  DDIMER 1.04*   CBG:  Recent Labs Lab 01/06/15 1117 01/06/15 1622 01/06/15 2113 01/07/15 0708 01/07/15 1206 01/07/15 1318  GLUCAP 203* 171* 226* 154* 151* 135*   Fasting Lipid Panel:  Recent Labs Lab 01/04/15 2305  CHOL 154  HDL 44  LDLCALC 90  TRIG 102  CHOLHDL 3.5   Thyroid Function Tests:  Recent Labs Lab 01/04/15 2305  TSH 3.353   Medications:  I have reviewed the patient's current medications. Prior to Admission:  Prescriptions prior to admission  Medication Sig Dispense Refill Last Dose  . glipiZIDE (GLUCOTROL) 10 MG tablet Take 10 mg by mouth 2 (two) times daily before a meal.   01/04/2015 at Unknown time  . lisinopril (PRINIVIL,ZESTRIL) 40 MG tablet Take 40 mg by mouth daily.   01/04/2015 at Unknown time  . lisinopril-hydrochlorothiazide (PRINZIDE,ZESTORETIC) 10-12.5 MG per tablet Take 1 tablet by mouth 2 (two) times daily.   01/04/2015 at Unknown time  . lovastatin (MEVACOR)  20 MG tablet Take 20 mg by mouth at bedtime.   01/04/2015 at Unknown time  . metFORMIN (GLUCOPHAGE) 1000 MG tablet Take 1,000 mg by mouth 2 (two) times daily with a meal.   01/03/2015 at Unknown time  . chlorthalidone (HYGROTON) 25 MG tablet Take 25 mg by mouth daily.   09/26/2013 at Unknown time  . Liraglutide (VICTOZA [place]) Inject 1.2 mLs into the skin daily.   09/26/2013 at Unknown time   Scheduled Meds: . [START ON 01/08/2015] aspirin EC  81 mg Oral Daily  . carvedilol  6.25 mg Oral BID WC  . [START ON 01/08/2015] heparin  5,000 Units Subcutaneous 3 times per day  . insulin aspart  0-9 Units Subcutaneous TID WC  . lisinopril  40 mg Oral Daily  . pantoprazole  40 mg Oral BID AC  . pravastatin  20 mg Oral q1800  . sodium chloride  3 mL Intravenous Q12H   Continuous Infusions: . sodium chloride    . 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 Echo revealed severe global reduction in LV function with EF of 20-25%, grade 2 diastolic dysfunction. Down 13 pounds since admission. Cath showed no significant coronary obstructive disease with 70% stenosis of LAD (ostial septal perforating artery) and 30-40% narrowing in mid nondominant RCA, 20% irregularity of AV groove circumflex. Cardiology recommends aggressive medical therapy to improve LV function. If LV function does not improve, prophylactic ICD may be necessary.  -Spironolactone 25 mg was discontinued -Carvedilol 6.25 mg BID -Lasix 40 mg IV BID was discontinued -Strict I/O's -Daily weights  -ASA 81 mg daily -Pravastatin 20 mg daily -Follow up echo in 3-4 months  HTN: 140/90 this morning. Patient is on lisinopril 40 mg daily at home.  -Carvedilol 6.25 mg BID -Spironolactone 25 mg was discontinued -Lisinopril 40 mg daily was discontinued -Lasix 40 mg IV BID  Hypokalemia: Potassium 3.6 this morning in the setting of diuresis. -Spironolactone 25 mg was discontinued -Repeat BMET tomorrow am  T2DM: HgbA1c of 11.0. Patient is on Victoza, Glipizide  daily, and Metformin at home. Patient will likely need to be started on insulin regimen for home as hgba1c is not well controlled on home regimen. Glucose has been 150-220s. -SSI-S -CBG AC/HS -Diet HH/Carb Mod  GERD: History  of GERD and on omeprazole at home. -Protonix 40 mg daily  HLD: On pravastatin 20 mg daily at home. LDL is 90. Based on cath results, may need to place patient on high intensity statin. -Continue pravastatin 20 mg daily  Full Code  Diet: HH/Carb Mod  DVT/PE ppx: Heparin TID  Dispo: Disposition is deferred at this time, awaiting improvement of current medical problems.  Anticipated discharge in approximately 1-2 day(s).   The patient does have a current PCP (Provider Not In System) and does need an [facility] hospital follow-up appointment after discharge.  The patient does not have transportation limitations that hinder  transportation to clinic appointments.  .Services Needed at time of discharge: Y = Yes, Blank = No PT:   OT:   RN:   Equipment:   Other:     LOS: 3 days   Jill Alexanders, DO PGY-1 Internal Medicine Resident Pager # [phone] 01/07/2015 2:43 PM

## 2015-01-07 NOTE — Progress Notes (Addendum)
1515 assessed pt cath site and notice there was a small amount of blood saturated on guaze changing from level 0 to level 1. Pedal pulses are palpable 2+ and site is soft to touch.  I marked site with permanent marker and called second RN Tiffany to check on site with me. Since pt was bedrest for 3 hours I called cath lab and spoke with lead tech who stated they will send someone to assess pt site.  1530 pt cath site remains level 1 and blood appears to begin spreading outside the marked area placed at 1515.   1555 cath tech Riki Rusk came to room and assessed pt cath site. Cath site appears to be in good condition with no abnormal bleeding. Cath site was redressed and at level 0. Pt to remain on scheduled bedrest till 1645. Will continue to monitor pt.  1715 pt off bedrest. Sitting at bedside. Cath site remains level 0. Soft to touch. Pt in good condition. Will continue to monitor.  1815 off bedrest and went walking. Walked 500 + ft. Cath site looks good and remains level 0. Will continue to monitor.

## 2015-01-07 NOTE — Progress Notes (Signed)
I stopped back in to see Mr. Jerry Bean and reinforce education regarding his HF.  He understands all topics discussed yesterday and is able to teach back those topics.  He was encouraged that he did not have any significant CAD found in his cardiac catheterization and feels ready to go home and make necessary changes for his health.  He did express some concerns regarding the affordability of new medications at discharge and going forward as he does not have health insurance coverage at this time.  I will collaborate with care management tomorrow regarding his new home medications.  I again encouraged him to call me with concerns or questions after discharge.

## 2015-01-07 NOTE — Progress Notes (Signed)
Patient currently at level 0 right groin cath site.  No complaints just minor discomfort at the site.  The site is soft and no evidence of any issues noted at this time. Will continue to monitor for any changes.

## 2015-01-07 NOTE — Progress Notes (Signed)
23fr sheath and 65fr sheaths removed by Jannet Mantis, At 12:00 and 12:04. I relieved Debbie at 12:10. Manual pressure held on both sites for 25 minutes. Venous site rebleed, manual pressure resumed for an additional 20 minutes to both sites. Total hold time was 45 minutes.. Tegaderm dressing applied, bedrest instructions given. No s+s of hematoma, groin level 0.  Bedrest begins at 12:45:00

## 2015-01-07 NOTE — Care Management Note (Addendum)
    Page 1 of 1   01/09/2015     2:33:05 PM CARE MANAGEMENT NOTE 01/09/2015  Patient:  Jerry Bean, Jerry Bean   Account Number:  [id number]  Date Initiated:  01/07/2015  Documentation initiated by:  Chesnie Capell  Subjective/Objective Assessment:   Pt adm on 01/04/15 with new onset CHF.  PTA, pt resides at home with spouse.     Action/Plan:   Cath today; EF 15-20%; may need Lifevest at dc, per MD notes.   Anticipated DC Date:  01/08/2015   Anticipated DC Plan:  HOME/SELF CARE  In-house referral  Financial Counselor      DC Planning Services  CM consult      Choice offered to / List presented to:             Status of service:  Completed, signed off Medicare Important Message given?  NO (If response is "NO", the following Medicare IM given date fields will be blank) Date Medicare IM given:   Medicare IM given by:   Date Additional Medicare IM given:   Additional Medicare IM given by:    Discharge Disposition:  HOME/SELF CARE  Per UR Regulation:  Reviewed for med. necessity/level of care/duration of stay  If discussed at Long Length of Stay Meetings, dates discussed:    Comments:  01/08/15 Sidney Ace, RN, BSN [phone] Pt to be fitted with Lifevest tonight around 6pm.  Wife has questions about self pay status and billing.  Called financial counselor A. Honeycutt, who stated she would call her in the room.  Pt has primary physician. Wife also interested in payment options for Lifevest. Unsure if pt will have to pay for Lifevest at this time, as it appears hospital is doing a LOG for pt.  Referred pt to Leafy Half with [employer]--wife already has [name] cell #.

## 2015-01-07 NOTE — Progress Notes (Signed)
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 Had some questions about the cath.  OBJECTIVE Filed Vitals:   01/06/15 1314 01/06/15 1928 01/06/15 2016 01/07/15 0500  BP: 144/97  143/96 140/90  Pulse: 103  99 86  Temp: 98 F (36.7 C)  98 F (36.7 C) 98.2 F (36.8 C)  TempSrc: Oral  Oral Oral  Resp: Height:      Weight:  158 lb 12.8 oz (72.031 kg)  157 lb 6.4 oz (71.396 kg)  SpO2: 99%  99% 96%    Intake/Output Summary (Last 24 hours) at 01/07/15 0734 Last data filed at 01/07/15 0648  Gross per 24 hour  Intake 1215.6 ml  Output   1200 ml  Net   15.6 ml   Filed Weights   01/06/15 0545 01/06/15 1928 01/07/15 0500  Weight: 158 lb 12.8 oz (72.031 kg) 158 lb 12.8 oz (72.031 kg) 157 lb 6.4 oz (71.396 kg)    PHYSICAL EXAM General: Well developed, well nourished, male in no acute distress. Head: Normocephalic, atraumatic.  Neck: Supple without bruits, JVD 8-9 cm. Lungs:  Resp regular and unlabored, few rales bases. Heart: RRR, S1, S2, ?S3, no S4, or murmur; no rub. Abdomen: Soft, non-tender, non-distended, BS + x 4.  Extremities: No clubbing, cyanosis, no edema.  Neuro: Alert and oriented X 3. Moves all extremities spontaneously. Psych: Normal affect.  LABS: CBC: Recent Labs  01/04/15 1110 01/06/15 0538  WBC 10.0 9.1  NEUTROABS 7.4  --     HGB 11.9* 11.3*  HCT 36.6* 35.0*  MCV 79.9 79.9  PLT 234 213   INR: Recent Labs  01/07/15 0452  INR 1.12   Basic Metabolic Panel: Recent Labs  01/04/15 1110 01/05/15 0359  01/06/15 0538 01/07/15 0452  NA 142 142  < > 140 141  K 3.1* 3.1*  < > 3.3* 3.6  CL 105 106  < > 109 107  CO2 25 27  < > 24 25  GLUCOSE 122* 148*  < > 135* 149*  BUN 12 10  < > 15 20  CREATININE 1.01 1.07  < > 1.02 1.19  CALCIUM 8.9 9.0  < > 8.8 9.1  MG 1.6 2.0  --   --   --   < > = values in this interval not displayed. Liver Function Tests: Recent Labs  01/04/15 1110  AST 18  ALT 8  ALKPHOS 59  BILITOT 0.7  PROT 7.8  ALBUMIN 3.6   Cardiac Enzymes: Recent Labs  01/04/15 2305 01/05/15 0359 01/05/15 1140  TROPONINI 0.04* 0.05* 0.04*    Recent Labs  01/04/15 1201  TROPIPOC 0.02   BNP:  B NATRIURETIC PEPTIDE  Date/Time Value Ref Range Status  01/04/2015 11:10 AM 463.0* 0.0 - 100.0 pg/mL Final   No results found for:  PROBNP D-dimer: Recent Labs  01/04/15 1316  DDIMER 1.04*   Hemoglobin A1C: Recent Labs  01/04/15 2305  HGBA1C 11.0*   Fasting Lipid Panel: Recent Labs  01/04/15 2305  CHOL 154  HDL 44  LDLCALC 90  TRIG 102  CHOLHDL 3.5   Thyroid Function Tests: Recent Labs  01/04/15 2305  TSH 3.353    TELE:    SR, ST    Radiology/Studies: Dg Chest 2 View 01/04/2015   CLINICAL DATA:  Shortness of breath. Abdominal pain and bloating since Thursday. Hypertension and diabetes.  EXAM: CHEST  2 VIEW  COMPARISON:  09/27/2013  FINDINGS: Midline trachea. Normal heart size and mediastinal contours. Trace bilateral pleural fluid. No pneumothorax. mild interstitial thickening, lower lobe predominant. Minimal left base subsegmental atelectasis.  IMPRESSION: Interstitial thickening which is new and suspicious for mild pulmonary venous congestion. Trace bilateral pleural effusions.   Electronically Signed   By: Jeronimo Greaves M.D.   On: 01/04/2015 13:00   Ct Angio Chest Pe W/cm  &/or Wo Cm 01/04/2015   CLINICAL DATA:  59 year old male with shortness of breath for 3 days.  EXAM: CT ANGIOGRAPHY CHEST WITH CONTRAST  TECHNIQUE: Multidetector CT imaging of the chest was performed using the standard protocol during bolus administration of intravenous contrast. Multiplanar CT image reconstructions and MIPs were obtained to evaluate the vascular anatomy.  CONTRAST:  90mL OMNIPAQUE IOHEXOL 350 MG/ML SOLN  COMPARISON:  01/04/2015 and prior chest radiographs. 01/11/2013 abdominal CT.  FINDINGS: This is a technically satisfactory study.  Mediastinum/Nodes: No pulmonary emboli are identified.  There is no evidence of thoracic aortic aneurysm.  Cardiomegaly and moderate coronary artery calcifications are noted. A small infarct at the left ventricular apex is noted. No enlarged lymph nodes are identified.  Lungs/Pleura: Moderate bilateral airspace/ central ground-glass opacities are noted likely representing pulmonary edema. Moderate bibasilar atelectasis is noted.  Patchy right upper lobe airspace/ground-glass opacities likely represent focal edema.  Bilateral pleural effusions, small to moderate on the right and small on the left, noted.  Upper abdomen: Unremarkable  Musculoskeletal: Unremarkable  Review of the MIP images confirms the above findings.  IMPRESSION: Moderate bilateral central airspace/ ground-glass opacities likely representing moderate pulmonary edema. Bilateral pleural effusions, small to moderate on the right and small on the left. Moderate bibasilar atelectasis.  No evidence of pulmonary emboli or thoracic aortic aneurysm.  Cardiomegaly and coronary artery disease. Small left ventricular apical infarct.   Electronically Signed   By: Harmon Pier M.D.   On: 01/04/2015 16:27    Current Medications:  . aspirin EC  81 mg Oral Daily  . carvedilol  3.125 mg Oral BID WC  . enoxaparin (LOVENOX) injection  40 mg Subcutaneous Daily  . furosemide  40 mg Intravenous BID  . insulin aspart   0-9 Units Subcutaneous TID WC  . lisinopril  40 mg Oral Daily  . pantoprazole  40 mg Oral BID AC  . potassium chloride  40 mEq Oral BID  . pravastatin  20 mg Oral q1800  . sodium chloride  3 mL Intravenous Q12H  . sodium chloride  3 mL Intravenous Q12H  . spironolactone  25 mg Oral Daily   . 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  EF 20-25% - Wt down 13 lbs since admit - hold IV Lasix today for cath - volume status looks good - will increase Coreg - on  IVF at 1 cc/kg pre-cath, will decrease  Active Problems:   Pulmonary edema - see above - otherwise, per IM    Uncontrolled hypertension - see above, otherwise per IM    DM (diabetes mellitus), type 2, uncontrolled - per IM    GERD (gastroesophageal reflux disease) - per IM    Hypokalemia - per IM    Normocytic anemia - per IM    Elevated troponin - in setting of Acute CHF  - per IM - think 2nd CHF    Coronary artery calcification seen on CAT scan - for cath today  Signed, Theodore Demark , PA-C 7:34 AM 01/07/2015  Personally seen and examined. Agree with above. Await cath. KCL 40 x 1. K-3.6 Able to lay flat for cath. Discussed.  Low salt, restrict fluids.   Donato Schultz, MD

## 2015-01-08 DIAGNOSIS — I420 Dilated cardiomyopathy: Secondary | ICD-10-CM

## 2015-01-08 LAB — GLUCOSE, CAPILLARY
GLUCOSE-CAPILLARY: 158 mg/dL — AB (ref 70–99)
GLUCOSE-CAPILLARY: 141 mg/dL — AB (ref 70–99)
Glucose-Capillary: 162 mg/dL — ABNORMAL HIGH (ref 70–99)

## 2015-01-08 LAB — CBC
HEMATOCRIT: 34.8 % — AB (ref 39.0–52.0)
HEMOGLOBIN: 11.2 g/dL — AB (ref 13.0–17.0)
MCH: 25.7 pg — ABNORMAL LOW (ref 26.0–34.0)
MCHC: 32.2 g/dL (ref 30.0–36.0)
MCV: 79.8 fL (ref 78.0–100.0)
PLATELETS: 222 10*3/uL (ref 150–400)
RBC: 4.36 MIL/uL (ref 4.22–5.81)
RDW: 15.5 % (ref 11.5–15.5)
WBC: 8.2 10*3/uL (ref 4.0–10.5)

## 2015-01-08 LAB — BASIC METABOLIC PANEL
ANION GAP: 8 (ref 5–15)
BUN: 23 mg/dL (ref 6–23)
CO2: 25 mmol/L (ref 19–32)
CREATININE: 1.26 mg/dL (ref 0.50–1.35)
Calcium: 9.6 mg/dL (ref 8.4–10.5)
Chloride: 108 mmol/L (ref 96–112)
GFR, EST AFRICAN AMERICAN: 70 mL/min — AB (ref >90)
GFR, EST NON AFRICAN AMERICAN: 60 mL/min — AB (ref >90)
GLUCOSE: 150 mg/dL — AB (ref 70–99)
Potassium: 3.8 mmol/L (ref 3.5–5.1)
SODIUM: 141 mmol/L (ref 135–145)

## 2015-01-08 MED ORDER — ATORVASTATIN CALCIUM 40 MG PO TABS
40.0000 mg | ORAL_TABLET | Freq: Every day | ORAL | Status: DC
  Administered 2015-01-08: 40 mg via ORAL
  Filled 2015-01-08 (×2): qty 1

## 2015-01-08 MED ORDER — CARVEDILOL 6.25 MG PO TABS: 6.2500 mg | ORAL_TABLET | Freq: Two times a day (BID) | ORAL | Status: DC

## 2015-01-08 MED ORDER — FUROSEMIDE 40 MG PO TABS: 40.0000 mg | ORAL_TABLET | Freq: Every day | ORAL | Status: DC

## 2015-01-08 MED ORDER — ATORVASTATIN CALCIUM 40 MG PO TABS: 40.0000 mg | ORAL_TABLET | Freq: Every day | ORAL | Status: DC

## 2015-01-08 MED ORDER — ASPIRIN 81 MG PO TBEC: 81.0000 mg | DELAYED_RELEASE_TABLET | Freq: Every day | ORAL | Status: AC

## 2015-01-08 MED ORDER — FUROSEMIDE 40 MG PO TABS
40.0000 mg | ORAL_TABLET | Freq: Every day | ORAL | Status: DC
  Administered 2015-01-08: 40 mg via ORAL
  Filled 2015-01-08: qty 1

## 2015-01-08 NOTE — Progress Notes (Signed)
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  EF 20-25% Active Problems:   Pulmonary edema   Uncontrolled hypertension   DM (diabetes mellitus), type 2, uncontrolled   GERD (gastroesophageal reflux disease)   Hypokalemia   Normocytic anemia   SOB (shortness of breath)   Elevated troponin - in setting of Acute CHF    Coronary artery calcification seen on CAT scan   NICM (nonischemic cardiomyopathy)   Primary Cardiologist: Herbie Baltimore   Patient Profile: 59 yo male w/ hx DM, HTN, GERD, no cardiac issues, was admitted 03/05 w/ SOB, new dx S-CHF. NICM by cath 03/08.  SUBJECTIVE: Breathing well, feels ready for d/c. OK with getting LifeVest.  OBJECTIVE Filed Vitals:   01/07/15 1430 01/07/15 1500 01/07/15 1900 01/08/15 0516  BP: 133/91 132/95 125/83 138/95  Pulse: 90 89 85 87  Temp:   97.4 F (36.3 C) 98.5 F (36.9 C)  TempSrc:   Oral Oral  Resp: Height:      Weight:    156 lb 3.2 oz (70.852 kg)  SpO2:   97% 99%    Intake/Output Summary (Last 24 hours) at 01/08/15 1021 Last data filed at 01/08/15 0942  Gross per 24 hour  Intake   1905 ml  Output   1475 ml  Net    430 ml   Filed Weights   01/06/15 1928 01/07/15 0500 01/08/15 0516  Weight: 158 lb 12.8 oz (72.031 kg) 157 lb 6.4 oz (71.396 kg) 156 lb 3.2 oz (70.852 kg)    PHYSICAL EXAM General: Well developed, well nourished, male in no acute distress. Head: Normocephalic, atraumatic.  Neck: Supple without bruits, JVD 6 cm. Lungs:  Resp regular and unlabored, CTA. Heart: RRR, S1, S2, no S3, S4, soft murmur; no rub. Abdomen: Soft, non-tender, non-distended, BS + x 4.  Extremities: No clubbing, cyanosis, no edema. Right groin cath site without ecchymosis or hematoma Neuro: Alert and oriented X 3. Moves all extremities spontaneously. Psych: Normal affect.  LABS: CBC: Recent Labs  01/07/15 1730  01/08/15 0535  WBC 8.9 8.2  HGB 12.1* 11.2*  HCT 38.1* 34.8*  MCV 81.9 79.8  PLT 265 222   INR: Recent Labs  01/07/15 0452  INR 1.12   Basic Metabolic Panel: Recent Labs  01/07/15 0452 01/07/15 1730 01/08/15 0535  NA 141  --  141  K 3.6  --  3.8  CL 107  --  108  CO2 25  --  25  GLUCOSE 149*  --  150*  BUN 20  --  23  CREATININE 1.19 1.31 1.26  CALCIUM 9.1  --  9.6   Cardiac Enzymes: Recent Labs  01/05/15 1140  TROPONINI 0.04*   BNP:  B NATRIURETIC PEPTIDE  Date/Time Value Ref Range Status  01/04/2015 11:10 AM 463.0* 0.0 - 100.0 pg/mL Final    TELE:    Current Medications:  . aspirin EC  81 mg Oral Daily  . carvedilol  6.25 mg Oral BID WC  . heparin  5,000 Units Subcutaneous 3 times per day  . insulin aspart  0-9 Units Subcutaneous TID WC  . lisinopril  40 mg Oral Daily  . pantoprazole  40 mg Oral BID AC  . pravastatin  20 mg Oral q1800  . sodium chloride  3 mL Intravenous Q12H   . sodium chloride    . sodium chloride Stopped (  01/07/15 1938)    ASSESSMENT AND PLAN: Principal Problem:  Acute combined systolic and diastolic heart failure - New Dx. EF 20-25% - Wt down 14 lbs since admit - Not currently on Lasix, MD advise on daily dose or PRN only - volume status looks good - Coreg increased 03/08, baseline HR still > 75, SBP 120s-140s  Active Problems:  Pulmonary edema - see above - otherwise, per IM   Uncontrolled hypertension - see above, otherwise per IM   DM (diabetes mellitus), type 2, uncontrolled - per IM   GERD (gastroesophageal reflux disease) - per IM   Hypokalemia - per IM   Normocytic anemia - per IM   Elevated troponin - in setting of Acute CHF  - per IM - think 2nd CHF   Coronary artery calcification seen on CAT scan - s/p cath w/ no critical CAD, 70% ostial septal perforator off the LAD, med rx     NICM - LifeVest order form signed - Echo in 3 months, ICD if does not improve.  SignedTheodore Demark , PA-C 10:21 AM 01/08/2015  Personally seen and examined. Agree with above. Life Vest Ready for DC  Donato Schultz, MD

## 2015-01-08 NOTE — Discharge Summary (Signed)
Name: Jerry Bean MRN: [id number] DOB: June 01, 1956 59 y.o. PCP: Dr. Wille Celeste  Date of Admission: 01/04/2015 11:47 AM Date of Discharge: 01/09/2015 Attending Physician: Dr. 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  EF 20-25% Active Problems:   Pulmonary edema   Uncontrolled hypertension   DM (diabetes mellitus), type 2, uncontrolled   GERD (gastroesophageal reflux disease)   Hypokalemia   Normocytic anemia   Coronary artery calcification seen on CAT scan   NICM (nonischemic cardiomyopathy)  Discharge Medications:   Medication List    STOP taking these medications        chlorthalidone 25 MG tablet  Commonly known as:  HYGROTON     lisinopril-hydrochlorothiazide 10-12.5 MG per tablet  Commonly known as:  PRINZIDE,ZESTORETIC     lovastatin 20 MG tablet  Commonly known as:  MEVACOR      TAKE these medications        aspirin 81 MG EC tablet  Take 1 tablet (81 mg total) by mouth daily.     atorvastatin 40 MG tablet  Commonly known as:  LIPITOR  Take 1 tablet (40 mg total) by mouth daily at 6 PM.     carvedilol 6.25 MG tablet  Commonly known as:  COREG  Take 1 tablet (6.25 mg total) by mouth 2 (two) times daily with a meal.     furosemide 40 MG tablet  Commonly known as:  LASIX  Take 1 tablet (40 mg total) by mouth daily.     glipiZIDE 10 MG tablet  Commonly known as:  GLUCOTROL  Take 10 mg by mouth 2 (two) times daily before a meal.     lisinopril 40 MG tablet  Commonly known as:  PRINIVIL,ZESTRIL  Take 40 mg by mouth daily.     metFORMIN 1000 MG tablet  Commonly known as:  GLUCOPHAGE  Take 1,000 mg by mouth 2 (two) times daily with a meal.     VICTOZA [place]  Inject 1.2 mLs into the skin daily.        Disposition and follow-up:   Jerry Bean was discharged from [facility] in Good condition.  At the hospital follow up visit please address:  1.  Newly Diagnosed  Severe Non-Ischemic Dilated Cardiomyopathy: Please address improvement of symptoms and compliance with medications.  T2DM: Please assess glucose control and consider changing to an insulin regimen.   HTN: Please address BP control on new medications  Hypokalemia: Please consider rechecking BMET given diuresis.   2.  Labs / imaging needed at time of follow-up: Echo in 3-4 months, BMET  3.  Pending labs/ test needing follow-up: None  Follow-up Appointments: Follow-up Information    Follow up with Donato Schultz, MD.   Specialty:  Cardiology   Why:  1-2 weeks for hospital follow up. They may call you for an appointment, but please call them if you do not hear from them.   Contact information:   [address] [place] [address] [phone]       Follow up with Egbert Garibaldi, NP On 01/11/2015.   Why:  for follow up and adjustment of diabetic medications   Contact information:   [facility] [address] [place] [address] [phone]       Discharge Instructions: Discharge Instructions    Diet - low sodium heart healthy    Complete by:  As directed  Increase activity slowly    Complete by:  As directed            Consultations: Treatment Team:  Rounding Lbcardiology, MD  Procedures Performed:  Dg Chest 2 View  01/04/2015   CLINICAL DATA:  Shortness of breath. Abdominal pain and bloating since Thursday. Hypertension and diabetes.  EXAM: CHEST  2 VIEW  COMPARISON:  09/27/2013  FINDINGS: Midline trachea. Normal heart size and mediastinal contours. Trace bilateral pleural fluid. No pneumothorax. mild interstitial thickening, lower lobe predominant. Minimal left base subsegmental atelectasis.  IMPRESSION: Interstitial thickening which is new and suspicious for mild pulmonary venous congestion. Trace bilateral pleural effusions.   Electronically Signed   By: Jeronimo Greaves M.D.   On: 01/04/2015 13:00   Ct Angio Chest Pe W/cm  &/or Wo Cm  01/04/2015   CLINICAL DATA:  59 year old male with shortness of breath for 3 days.  EXAM: CT ANGIOGRAPHY CHEST WITH CONTRAST  TECHNIQUE: Multidetector CT imaging of the chest was performed using the standard protocol during bolus administration of intravenous contrast. Multiplanar CT image reconstructions and MIPs were obtained to evaluate the vascular anatomy.  CONTRAST:  73mL OMNIPAQUE IOHEXOL 350 MG/ML SOLN  COMPARISON:  01/04/2015 and prior chest radiographs. 01/11/2013 abdominal CT.  FINDINGS: This is a technically satisfactory study.  Mediastinum/Nodes: No pulmonary emboli are identified.  There is no evidence of thoracic aortic aneurysm.  Cardiomegaly and moderate coronary artery calcifications are noted. A small infarct at the left ventricular apex is noted. No enlarged lymph nodes are identified.  Lungs/Pleura: Moderate bilateral airspace/ central ground-glass opacities are noted likely representing pulmonary edema. Moderate bibasilar atelectasis is noted.  Patchy right upper lobe airspace/ground-glass opacities likely represent focal edema.  Bilateral pleural effusions, small to moderate on the right and small on the left, noted.  Upper abdomen: Unremarkable  Musculoskeletal: Unremarkable  Review of the MIP images confirms the above findings.  IMPRESSION: Moderate bilateral central airspace/ ground-glass opacities likely representing moderate pulmonary edema. Bilateral pleural effusions, small to moderate on the right and small on the left. Moderate bibasilar atelectasis.  No evidence of pulmonary emboli or thoracic aortic aneurysm.  Cardiomegaly and coronary artery disease. Small left ventricular apical infarct.   Electronically Signed   By: Harmon Pier M.D.   On: 01/04/2015 16:27    2D Echo:  - Severe global reduction in LV function; grade 2 diastolic dysfunction with elevated LV filling pressure; severe LAE; moderate MR; small pericardial effusion; left pleural effusion.  Cardiac  Cath:  No significant coronary obstructive disease with mild luminal irregularities and evidence for 70% ostial septal perforating artery stenosis in the LAD, 20% areas of irregularity in the mid AV groove circumflex, and smooth 30-40% narrowing in the mid nondominant RCA.  Admission HPI: Mr. Friloux is a 59 yo male with PMHx of uncontrolled T2DM, uncontrolled HTN and GERD who presents to the ED with SOB for 3 days, worse laying flat and also moving around. It started 3 days ago and has been progressively worsening making him come to ED today after PCP's suggestion. He cannot lie flat or walk more than few blocks without getting SOB. Has dry cough. He has been using 4 pillows to sleep recently. Denies any fever or any chest pain. No hx of dvt/pe.   BNP 463. K 3.1. Hgb 11.9 trop 0.02. D-dimer 1.04. CT angio neg for PE but Shows b/l pulm edema.  Hospital Course by problem list: Principal Problem:   Acute combined systolic and diastolic heart failure -  New Dx.  EF 20-25% Active Problems:   Pulmonary edema   Uncontrolled hypertension   DM (diabetes mellitus), type 2, uncontrolled   GERD (gastroesophageal reflux disease)   Hypokalemia   Normocytic anemia   Coronary artery calcification seen on CAT scan   NICM (nonischemic cardiomyopathy)   Severe Non-ischemic Dilated Cardiomyopathy: Newly diagnosed combined CHF with severe global reduction in LV function with EF of 20-25%, grade 2 diastolic dysfunction. Down 14 pounds since admission. Cath showed no significant coronary obstructive disease. Cardiology recommended aggressive medical therapy to improve LV function. If LV function does not improve, prophylactic ICD may be necessary. Patient a lifevest upon discharge from the hospital. Patient was discharged on ASA 81 mg daily, Atorvastatin 40 mg daily, Lisinopril 40 mg daily, Carvedilol 6.25 mg BID and lasix 40 mg daily. Patient was educated that he should carefully monitoring weights. If greater than a 3  pound weight gain, patient should take an additional Lasix 40 mg. Patient will have a repeat echo in 3-4 months. Please make sure patient is following with cardiology and heart failure team.  HTN: Blood pressure was well controlled. Patient is on lisinopril 40 mg daily at home. Patient was discharged home on Carvedilol 6.25 mg BID, Lisinopril 40 mg daily, and lasix 40 mg daily.  T2DM: HgbA1c of 11.0 on 01/04/15. Patient is on Victoza, Glipizide  daily, and Metformin at home. Patient will likely need to be on an insulin regimen due to uncontrolled diabetes. Control will be additionally important now given cardiac co-morbidities. This was discussed with the patient and he understands. Please consider transitioning patient to affordable insulin regimen such as 70/30 or referral to Endocrinology.  HLD: On pravastatin 20 mg daily at home. LDL is 90. Given known CVD and ASCVD risk of >20% we increased statin to high-intensity atorvastatin 40 mg daily  Discharge Vitals:   BP 126/89 mmHg  Pulse 90  Temp(Src) 98.5 F (36.9 C) (Oral)  Resp 18  Ht  (1.803 m)  Wt 156 lb 3.2 oz (70.852 kg)  BMI 21.80 kg/m2  SpO2 99%  Discharge Labs:  Results for orders placed or performed during the hospital encounter of 01/04/15 (from the past 24 hour(s))  Glucose, capillary     Status: Abnormal   Collection Time: 01/08/15  4:08 PM  Result Value Ref Range   Glucose-Capillary 141 (H) 70 - 99 mg/dL   Comment 1 Notify RN     Signed: Jill Alexanders, DO PGY-1 Internal Medicine Resident Pager # [phone] 01/09/2015 1:41 PM  Services Ordered on Discharge: None Equipment Ordered on Discharge: None

## 2015-01-08 NOTE — Progress Notes (Signed)
For d/c today . Waiting on lifevest measurement

## 2015-01-08 NOTE — Discharge Instructions (Signed)
Thank you for allowing [place] to be involved in your healthcare while you were hospitalized at [facility].   Please note that there have been changes to your home medications.  --> PLEASE LOOK AT YOUR DISCHARGE MEDICATION LIST FOR DETAILS.  Please call your PCP if you have any questions or concerns, or any difficulty getting any of your medications.  Please return to the ER if you have worsening of your symptoms or new severe symptoms arise.  Please take: -aspirin 81 mg daily -atorvastatin 40 mg daily (for cholesterol) -carvedilol 6.25 mg twice a day (for your heart and blood pressure) -lasix (furosemide) 40 mg once a day (for your heart and blood pressure)  ---if your weight should increase by more than 3-4 pounds, take an additional lasix pill as you may be retaining fluid. Your weight on discharge from the hospital was 156. Please weigh yourself everyday!  -Continue lisinopril 40 mg daily (for your heart and blood pressure)  Heart Failure Heart failure is a condition in which the heart has trouble pumping blood. This means your heart does not pump blood efficiently for your body to work well. In some cases of heart failure, fluid may back up into your lungs or you may have swelling (edema) in your lower legs. Heart failure is usually a long-term (chronic) condition. It is important for you to take good care of yourself and follow your health care provider's treatment plan. CAUSES  Some health conditions can cause heart failure. Those health conditions include:  High blood pressure (hypertension). Hypertension causes the heart muscle to work harder than normal. When pressure in the blood vessels is high, the heart needs to pump (contract) with more force in order to circulate blood throughout the body. High blood pressure eventually causes the heart to become stiff and weak.  Coronary artery disease (CAD). CAD is the buildup of cholesterol and fat (plaque) in the arteries  of the heart. The blockage in the arteries deprives the heart muscle of oxygen and blood. This can cause chest pain and may lead to a heart attack. High blood pressure can also contribute to CAD.  Heart attack (myocardial infarction). A heart attack occurs when one or more arteries in the heart become blocked. The loss of oxygen damages the muscle tissue of the heart. When this happens, part of the heart muscle dies. The injured tissue does not contract as well and weakens the heart's ability to pump blood.  Abnormal heart valves. When the heart valves do not open and close properly, it can cause heart failure. This makes the heart muscle pump harder to keep the blood flowing.  Heart muscle disease (cardiomyopathy or myocarditis). Heart muscle disease is damage to the heart muscle from a variety of causes. These can include drug or alcohol abuse, infections, or unknown reasons. These can increase the risk of heart failure.  Lung disease. Lung disease makes the heart work harder because the lungs do not work properly. This can cause a strain on the heart, leading it to fail.  Diabetes. Diabetes increases the risk of heart failure. High blood sugar contributes to high fat (lipid) levels in the blood. Diabetes can also cause slow damage to tiny blood vessels that carry important nutrients to the heart muscle. When the heart does not get enough oxygen and food, it can cause the heart to become weak and stiff. This leads to a heart that does not contract efficiently.  Other conditions can contribute to heart failure.  These include abnormal heart rhythms, thyroid problems, and low blood counts (anemia). Certain unhealthy behaviors can increase the risk of heart failure, including:  Being overweight.  Smoking or chewing tobacco.  Eating foods high in fat and cholesterol.  Abusing illicit drugs or alcohol.  Lacking physical activity. SYMPTOMS  Heart failure symptoms may vary and can be hard to  detect. Symptoms may include:  Shortness of breath with activity, such as climbing stairs.  Persistent cough.  Swelling of the feet, ankles, legs, or abdomen.  Unexplained weight gain.  Difficulty breathing when lying flat (orthopnea).  Waking from sleep because of the need to sit up and get more air.  Rapid heartbeat.  Fatigue and loss of energy.  Feeling light-headed, dizzy, or close to fainting.  Loss of appetite.  Nausea.  Increased urination during the night (nocturia). DIAGNOSIS  A diagnosis of heart failure is based on your history, symptoms, physical examination, and diagnostic tests. Diagnostic tests for heart failure may include:  Echocardiography.  Electrocardiography.  Chest X-ray.  Blood tests.  Exercise stress test.  Cardiac angiography.  Radionuclide scans. TREATMENT  Treatment is aimed at managing the symptoms of heart failure. Medicines, behavioral changes, or surgical intervention may be necessary to treat heart failure.  Medicines to help treat heart failure may include:  Angiotensin-converting enzyme (ACE) inhibitors. This type of medicine blocks the effects of a blood protein called angiotensin-converting enzyme. ACE inhibitors relax (dilate) the blood vessels and help lower blood pressure.  Angiotensin receptor blockers (ARBs). This type of medicine blocks the actions of a blood protein called angiotensin. Angiotensin receptor blockers dilate the blood vessels and help lower blood pressure.  Water pills (diuretics). Diuretics cause the kidneys to remove salt and water from the blood. The extra fluid is removed through urination. This loss of extra fluid lowers the volume of blood the heart pumps.  Beta blockers. These prevent the heart from beating too fast and improve heart muscle strength.  Digitalis. This increases the force of the heartbeat.  Healthy behavior changes include:  Obtaining and maintaining a healthy weight.  Stopping  smoking or chewing tobacco.  Eating heart-healthy foods.  Limiting or avoiding alcohol.  Stopping illicit drug use.  Physical activity as directed by your health care provider.  Surgical treatment for heart failure may include:  A procedure to open blocked arteries, repair damaged heart valves, or remove damaged heart muscle tissue.  A pacemaker to improve heart muscle function and control certain abnormal heart rhythms.  An internal cardioverter defibrillator to treat certain serious abnormal heart rhythms.  A left ventricular assist device (LVAD) to assist the pumping ability of the heart. HOME CARE INSTRUCTIONS   Take medicines only as directed by your health care provider. Medicines are important in reducing the workload of your heart, slowing the progression of heart failure, and improving your symptoms.  Do not stop taking your medicine unless directed by your health care provider.  Do not skip any dose of medicine.  Refill your prescriptions before you run out of medicine. Your medicines are needed every day.  Engage in moderate physical activity if directed by your health care provider. Moderate physical activity can benefit some people. The elderly and people with severe heart failure should consult with a health care provider for physical activity recommendations.  Eat heart-healthy foods. Food choices should be free of trans fat and low in saturated fat, cholesterol, and salt (sodium). Healthy choices include fresh or frozen fruits and vegetables, fish, lean meats, legumes,  fat-free or low-fat dairy products, and whole grain or high fiber foods. Talk to a dietitian to learn more about heart-healthy foods.  Limit sodium if directed by your health care provider. Sodium restriction may reduce symptoms of heart failure in some people. Talk to a dietitian to learn more about heart-healthy seasonings.  Use healthy cooking methods. Healthy cooking methods include roasting,  grilling, broiling, baking, poaching, steaming, or stir-frying. Talk to a dietitian to learn more about healthy cooking methods.  Limit fluids if directed by your health care provider. Fluid restriction may reduce symptoms of heart failure in some people.  Weigh yourself every day. Daily weights are important in the early recognition of excess fluid. You should weigh yourself every morning after you urinate and before you eat breakfast. Wear the same amount of clothing each time you weigh yourself. Record your daily weight. Provide your health care provider with your weight record.  Monitor and record your blood pressure if directed by your health care provider.  Check your pulse if directed by your health care provider.  Lose weight if directed by your health care provider. Weight loss may reduce symptoms of heart failure in some people.  Stop smoking or chewing tobacco. Nicotine makes your heart work harder by causing your blood vessels to constrict. Do not use nicotine gum or patches before talking to your health care provider.  Keep all follow-up visits as directed by your health care provider. This is important.  Limit alcohol intake to no more than 1 drink per day for nonpregnant women and 2 drinks per day for men. One drink equals 12 ounces of beer, 5 ounces of wine, or 1 ounces of hard liquor. Drinking more than that is harmful to your heart. Tell your health care provider if you drink alcohol several times a week. Talk with your health care provider about whether alcohol is safe for you. If your heart has already been damaged by alcohol or you have severe heart failure, drinking alcohol should be stopped completely.  Stop illicit drug use.  Stay up-to-date with immunizations. It is especially important to prevent respiratory infections through current pneumococcal and influenza immunizations.  Manage other health conditions such as hypertension, diabetes, thyroid disease, or abnormal  heart rhythms as directed by your health care provider.  Learn to manage stress.  Plan rest periods when fatigued.  Learn strategies to manage high temperatures. If the weather is extremely hot:  Avoid vigorous physical activity.  Use air conditioning or fans or seek a cooler location.  Avoid caffeine and alcohol.  Wear loose-fitting, lightweight, and light-colored clothing.  Learn strategies to manage cold temperatures. If the weather is extremely cold:  Avoid vigorous physical activity.  Layer clothes.  Wear mittens or gloves, a hat, and a scarf when going outside.  Avoid alcohol.  Obtain ongoing education and support as needed.  Participate in or seek rehabilitation as needed to maintain or improve independence and quality of life. SEEK MEDICAL CARE IF:   Your weight increases by 03 lb/1.4 kg in 1 day or 05 lb/2.3 kg in a week.  You have increasing shortness of breath that is unusual for you.  You are unable to participate in your usual physical activities.  You tire easily.  You cough more than normal, especially with physical activity.  You have any or more swelling in areas such as your hands, feet, ankles, or abdomen.  You are unable to sleep because it is hard to breathe.  You feel like  your heart is beating fast (palpitations).  You become dizzy or light-headed upon standing up. SEEK IMMEDIATE MEDICAL CARE IF:   You have difficulty breathing.  There is a change in mental status such as decreased alertness or difficulty with concentration.  You have a pain or discomfort in your chest.  You have an episode of fainting (syncope). MAKE SURE YOU:   Understand these instructions.  Will watch your condition.  Will get help right away if you are not doing well or get worse. Document Released: 10/18/2005 Document Revised: 03/04/2014 Document Reviewed: 11/17/2012 [facility] Patient Information 2015 [place], [place]. This information is not intended to  replace advice given to you by your health care provider. Make sure you discuss any questions you have with your health care provider.

## 2015-01-08 NOTE — Progress Notes (Signed)
Subjective:  Patient was seen and examined this morning. Patient is doing well this morning and denies any symptoms of chest pain or shortness of breath. He is concerned about affording his medications upon discharge.   Objective: Vital signs in last 24 hours: Filed Vitals:   01/07/15 1500 01/07/15 1900 01/08/15 0516 01/08/15 0800  BP: 132/95 125/83 138/95 126/89  Pulse: 89 85 87 90  Temp:  97.4 F (36.3 C) 98.5 F (36.9 C)   TempSrc:  Oral Oral   Resp: Height:      Weight:   156 lb 3.2 oz (70.852 kg)   SpO2:  97% 99%    Weight change: -2 lb 9.6 oz (-1.179 kg)  Intake/Output Summary (Last 24 hours) at 01/08/15 1156 Last data filed at 01/08/15 0942  Gross per 24 hour  Intake   1905 ml  Output   1475 ml  Net    430 ml   General: Vital signs reviewed.  Patient is well-developed and well-nourished, in no acute distress and cooperative with exam.  Cardiovascular: Regular rate, regular rhythm.  Pulmonary/Chest: CTA b/l no WRR. Abdominal: Soft, non-tender, non-distended, BS + Extremities: No lower extremity edema bilaterally. Sheath site is C/D/I well bandaged no signs of bleeding. Neurological: A&O x3 Skin: Warm, dry and intact. No rashes or erythema. Psychiatric: Normal mood and affect.   Lab Results: Basic Metabolic Panel:  Recent Labs Lab 01/04/15 1110 01/05/15 0359  01/07/15 0452 01/07/15 1730 01/08/15 0535  NA 142 142  < > 141  --  141  K 3.1* 3.1*  < > 3.6  --  3.8  CL 105 106  < > 107  --  108  CO2 25 27  < > 25  --  25  GLUCOSE 122* 148*  < > 149*  --  150*  BUN 12 10  < > 20  --  23  CREATININE 1.01 1.07  < > 1.19 1.31 1.26  CALCIUM 8.9 9.0  < > 9.1  --  9.6  MG 1.6 2.0  --   --   --   --   < > = values in this interval not displayed. Liver Function Tests:  Recent Labs Lab 01/04/15 1110  AST 18  ALT 8  ALKPHOS 59  BILITOT 0.7  PROT 7.8  ALBUMIN 3.6   CBC:  Recent Labs Lab 01/04/15 1110  01/07/15 1730 01/08/15 0535  WBC 10.0   < > 8.9 8.2  NEUTROABS 7.4  --   --   --   HGB 11.9*  < > 12.1* 11.2*  HCT 36.6*  < > 38.1* 34.8*  MCV 79.9  < > 81.9 79.8  PLT 234  < > 265 222  < > = values in this interval not displayed. Cardiac Enzymes:  Recent Labs Lab 01/04/15 2305 01/05/15 0359 01/05/15 1140  TROPONINI 0.04* 0.05* 0.04*   D-Dimer:  Recent Labs Lab 01/04/15 1316  DDIMER 1.04*   CBG:  Recent Labs Lab 01/07/15 1206 01/07/15 1318 01/07/15 1555 01/07/15 2045 01/08/15 0649 01/08/15 1111  GLUCAP 151* 135* 179* 237* 162* 158*   Fasting Lipid Panel:  Recent Labs Lab 01/04/15 2305  CHOL 154  HDL 44  LDLCALC 90  TRIG 102  CHOLHDL 3.5   Thyroid Function Tests:  Recent Labs Lab 01/04/15 2305  TSH 3.353   Medications:  I have reviewed the patient's current medications. Prior to Admission:  Prescriptions prior to admission  Medication Sig  Dispense Refill Last Dose  . glipiZIDE (GLUCOTROL) 10 MG tablet Take 10 mg by mouth 2 (two) times daily before a meal.   01/04/2015 at Unknown time  . lisinopril (PRINIVIL,ZESTRIL) 40 MG tablet Take 40 mg by mouth daily.   01/04/2015 at Unknown time  . lisinopril-hydrochlorothiazide (PRINZIDE,ZESTORETIC) 10-12.5 MG per tablet Take 1 tablet by mouth 2 (two) times daily.   01/04/2015 at Unknown time  . lovastatin (MEVACOR) 20 MG tablet Take 20 mg by mouth at bedtime.   01/04/2015 at Unknown time  . metFORMIN (GLUCOPHAGE) 1000 MG tablet Take 1,000 mg by mouth 2 (two) times daily with a meal.   01/03/2015 at Unknown time  . chlorthalidone (HYGROTON) 25 MG tablet Take 25 mg by mouth daily.   09/26/2013 at Unknown time  . Liraglutide (VICTOZA [place]) Inject 1.2 mLs into the skin daily.   09/26/2013 at Unknown time   Scheduled Meds: . aspirin EC  81 mg Oral Daily  . atorvastatin  40 mg Oral q1800  . carvedilol  6.25 mg Oral BID WC  . heparin  5,000 Units Subcutaneous 3 times per day  . insulin aspart  0-9 Units Subcutaneous TID WC  . lisinopril  40 mg Oral Daily  .  pantoprazole  40 mg Oral BID AC  . sodium chloride  3 mL Intravenous Q12H   Continuous Infusions: . sodium chloride    . 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 Down 14 pounds since admission. Cath showed no significant coronary obstructive disease. Cardiology recommended aggressive medical therapy to improve LV function. If LV function does not improve, prophylactic ICD may be necessary. Patient is receiving a lifevest as well.  -Strict I/O's -Daily weights  -ASA 81 mg daily -Atorvastatin 40 mg daily -Lisinopril 40 mg daily -Carvedilol 6.25 mg BID -Patient will likely need a diuretic upon discharge, will discuss with cardiology -Follow up echo in 3-4 months -Follow up with cardiology and heart failure team upon discharge  HTN: 138/95 this morning. Patient is on lisinopril 40 mg daily at home.  -Carvedilol 6.25 mg BID -Lisinopril 40 mg daily  -Patient will likely need a diuretic upon discharge, will discuss with cardiology  Hypokalemia: Potassium 3.8 this morning. -Resolved  T2DM: HgbA1c of 11.0. Patient is on Victoza, Glipizide  daily, and Metformin at home. Patient will need to be on an insulin regimen which can be initiated by his PCP for better control. This was discussed with the patient and he  understands.  -SSI-S -CBG AC/HS -Diet HH/Carb Mod  GERD: History of GERD and on omeprazole at home. -Protonix 40 mg daily  HLD: On pravastatin 20 mg daily at home. LDL is 90. Given known CVD and ASCVD risk of >20% we will increase statin to high-intensity.  -Atorvastatin 40 mg daily  Full Code  Diet: HH/Carb Mod  DVT/PE ppx: Heparin TID  Dispo: Disposition is deferred at this time, awaiting improvement of current medical problems.  Anticipated discharge in approximately 1-2 day(s).   The patient does have a current PCP (Provider Not In System) and does need an [facility] hospital follow-up appointment after discharge.  The patient does not have transportation limitations that hinder transportation to clinic appointments.  .Services Needed at time of discharge: Y = Yes, Blank = No PT:   OT:   RN:   Equipment:   Other:     LOS: 4 days   Jill Alexanders, DO PGY-1 Internal Medicine Resident Pager # [phone] 01/08/2015 11:56 AM

## 2015-01-10 ENCOUNTER — Telehealth: Payer: Self-pay | Admitting: Nurse Practitioner

## 2015-01-10 ENCOUNTER — Telehealth: Payer: Self-pay | Admitting: Cardiology

## 2015-01-10 NOTE — Telephone Encounter (Signed)
Patient contacted regarding discharge from [facility] on January 09, 2015.  Patient understands to follow up with provider Norma Fredrickson, NP on January 15, 2015 at 9:00 AM at [facility]. Patient understands discharge instructions? yes Patient understands medications and regiment? yes Patient understands to bring all medications to this visit? yes

## 2015-01-10 NOTE — Telephone Encounter (Signed)
Closed encounter °

## 2015-01-10 NOTE — Telephone Encounter (Signed)
New message ° ° ° ° ° °TCM appt on 01-15-15 per Rhonda. °

## 2015-01-15 ENCOUNTER — Ambulatory Visit (INDEPENDENT_AMBULATORY_CARE_PROVIDER_SITE_OTHER): Payer: Self-pay | Admitting: Nurse Practitioner

## 2015-01-15 ENCOUNTER — Encounter: Payer: Self-pay | Admitting: Nurse Practitioner

## 2015-01-15 VITALS — BP 110/80 | HR 79 | Ht 71.0 in | Wt 165.4 lb

## 2015-01-15 DIAGNOSIS — I5041 Acute combined systolic (congestive) and diastolic (congestive) heart failure: Secondary | ICD-10-CM

## 2015-01-15 DIAGNOSIS — I10 Essential (primary) hypertension: Secondary | ICD-10-CM

## 2015-01-15 DIAGNOSIS — R0600 Dyspnea, unspecified: Secondary | ICD-10-CM

## 2015-01-15 DIAGNOSIS — I428 Other cardiomyopathies: Secondary | ICD-10-CM

## 2015-01-15 DIAGNOSIS — I429 Cardiomyopathy, unspecified: Secondary | ICD-10-CM

## 2015-01-15 LAB — BASIC METABOLIC PANEL
BUN: 24 mg/dL — ABNORMAL HIGH (ref 6–23)
CO2: 30 mEq/L (ref 19–32)
Calcium: 9.6 mg/dL (ref 8.4–10.5)
Chloride: 104 mEq/L (ref 96–112)
Creatinine, Ser: 1.17 mg/dL (ref 0.40–1.50)
GFR: 81.6 mL/min (ref >60.00)
Glucose, Bld: 170 mg/dL — ABNORMAL HIGH (ref 70–99)
Potassium: 3.6 mEq/L (ref 3.5–5.1)
Sodium: 139 mEq/L (ref 135–145)

## 2015-01-15 LAB — BRAIN NATRIURETIC PEPTIDE: Pro B Natriuretic peptide (BNP): 188 pg/mL — ABNORMAL HIGH (ref 0.0–100.0)

## 2015-01-15 MED ORDER — CARVEDILOL 6.25 MG PO TABS: 9.7500 mg | ORAL_TABLET | Freq: Two times a day (BID) | ORAL | Status: DC

## 2015-01-15 NOTE — Progress Notes (Signed)
CARDIOLOGY OFFICE NOTE  Date:  01/15/2015    Jerry Bean Date of Birth: 1956-06-26 Medical Record #[id number]  PCP:  PROVIDER NOT IN SYSTEM  Cardiologist:  Herbie Baltimore    Chief Complaint  Patient presents with  . Congestive Heart Failure    TOC/post hospital visit - seen for Dr. Herbie Baltimore     History of Present Illness: Jerry Bean is a 59 y.o. male who presents today for a post hospital/TOC visit. Seen for Dr. Herbie Baltimore. He has had a history of uncontrolled DM and GERD.  Admitted earlier this month with increasing SOB, orthopnea and DOE. Saw his PCP - referred on to the ER. Underwent cardiac cath and echo - has newly diagnosed NICM. EF is  20 to 25%. He was diuresed. Placed on more of a heart failure regimen. For repeat echo after 3 months of therapy.  Comes back today. Here with his wife. Doing ok. He is doing well. No real shortness of breath. Has really changed his diet. Not using salt. Sugars improving as well. No chest pain. Not swelling. Weight stable between 156 and 157 at home. Not dizzy or lightheaded. Has his Life Vest - no problems noted. Planning to return to work next week - works nightshift in [employer]. Not very physical.    Past Medical History  Diagnosis Date  . Diabetes mellitus type 2, uncontrolled   . Essential hypertension   . GERD (gastroesophageal reflux disease)     Past Surgical History  Procedure Laterality Date  . Transthoracic echocardiogram  01/05/2015    EF 20-25%, diffuse Hypokinesis, Grade 2 DD, mild Ao root dilation, Mod MR, Severe LA dilation, Trivial AI  . Left and right heart catheterization with coronary angiogram N/A 01/07/2015    Procedure: LEFT AND RIGHT HEART CATHETERIZATION WITH CORONARY ANGIOGRAM;  Surgeon: Lennette Bihari, MD;  Location: [facility] CATH LAB;  Service: Cardiovascular;  Laterality: N/A;     Medications: Current Outpatient Prescriptions  Medication Sig Dispense Refill  . aspirin EC 81 MG EC tablet Take 1 tablet (81 mg total) by  mouth daily. 30 tablet 11  . atorvastatin (LIPITOR) 40 MG tablet Take 1 tablet (40 mg total) by mouth daily at 6 PM. 30 tablet 3  . carvedilol (COREG) 6.25 MG tablet Take 1.5 tablets (9.375 mg total) by mouth 2 (two) times daily with a meal. 60 tablet 3  . furosemide (LASIX) 40 MG tablet Take 1 tablet (40 mg total) by mouth daily. 30 tablet 3  . glipiZIDE (GLUCOTROL) 10 MG tablet Take 10 mg by mouth 2 (two) times daily before a meal.    . lisinopril (PRINIVIL,ZESTRIL) 40 MG tablet Take 40 mg by mouth daily.    . metFORMIN (GLUCOPHAGE) 1000 MG tablet Take 1,000 mg by mouth 2 (two) times daily with a meal.    . omeprazole (PRILOSEC) 20 MG capsule Take 20 mg by mouth daily.    Marland Kitchen VIAGRA 100 MG tablet Take 100 mg by mouth as needed for erectile dysfunction (pt has not tried medication yet).   0   No current facility-administered medications for this visit.    Allergies: No Known Allergies  Social History: The patient  reports that he has never smoked. He does not have any smokeless tobacco history on file. He reports that he does not drink alcohol or use illicit drugs.   Family History: The patient's family history includes AAA (abdominal aortic aneurysm) (age of onset: 34) in his brother; Diabetes in his mother.  Review of Systems: Please see the history of present illness.   Otherwise, the review of systems is positive for .   All other systems are reviewed and negative.   Physical Exam: VS:  BP 110/80 mmHg  Pulse 79  Ht  (1.803 m)  Wt 165 lb 6.4 oz (75.025 kg)  BMI 23.08 kg/m2  SpO2 100% .  BMI Body mass index is 23.08 kg/(m^2).  Wt Readings from Last 3 Encounters:  01/15/15 165 lb 6.4 oz (75.025 kg)  01/08/15 156 lb 3.2 oz (70.852 kg)  01/11/13 173 lb (78.472 kg)    General: Pleasant. Well developed, well nourished and in no acute distress.  HEENT: Normal. Neck: Supple, no JVD, carotid bruits, or masses noted.  Cardiac: Regular rate and rhythm. No murmurs, rubs, or  gallops. No edema.  Respiratory:  Lungs are clear to auscultation bilaterally with normal work of breathing.  GI: Soft and nontender.  MS: No deformity or atrophy. Gait and ROM intact. Skin: Warm and dry. Color is normal.  Neuro:  Strength and sensation are intact and no gross focal deficits noted.  Psych: Alert, appropriate and with normal affect.   LABORATORY DATA:  EKG:  EKG is not ordered today.   Lab Results  Component Value Date   WBC 8.2 01/08/2015   HGB 11.2* 01/08/2015   HCT 34.8* 01/08/2015   PLT 222 01/08/2015   GLUCOSE 150* 01/08/2015   CHOL 154 01/04/2015   TRIG 102 01/04/2015   HDL 44 01/04/2015   LDLCALC 90 01/04/2015   ALT 8 01/04/2015   AST 18 01/04/2015   NA 141 01/08/2015   K 3.8 01/08/2015   CL 108 01/08/2015   CREATININE 1.26 01/08/2015   BUN 23 01/08/2015   CO2 25 01/08/2015   TSH 3.353 01/04/2015   INR 1.12 01/07/2015   HGBA1C 11.0* 01/04/2015    BNP (last 3 results)  Recent Labs  01/04/15 1110  BNP 463.0*    ProBNP (last 3 results) No results for input(s): PROBNP in the last 8760 hours.   Other Studies Reviewed Today:  RIGHT AND LEFT HEART CARDIAC CATHETERIZATION   ANGIOGRAPHY:   Left main: Very large at least 6-7 mm left main which was normal and bifurcated into a large LAD and large dominant left circumflex coronary artery.  LAD: Large caliber vessel which gave rise to 4 diagonal branches and extended to and wrapped around the apex. The proximal septal perforating artery had 70% ostial smooth narrowing. There were minimal luminal irregularities of the LAD without obstructive disease.  Left circumflex: Large dominant vessel that gave rise to 2 major marginal branches, PDA and posterolateral branch. There was mild luminal narrowing of 20% in the AV groove circumflex between the OM1 and known 2 vessel..   Right coronary artery: Small nondominant vessel that had smooth 30% narrowing in the mid segment after its  bifurcation.  Left ventriculography revealed a dilated left ventricle with diffuse severe global hypocontractility with more severe hypo-to akinesis involving the distal anterolateral wall. Ejection fraction is approximately 15 to less than 20%.  Total contrast used: 70 cc Omnipaque  IMPRESSION:  Severe nonischemic dilated cardiomyopathy with an ejection fraction of 15 to less than 20%.  No significant coronary obstructive disease with mild luminal irregularities and evidence for 70% ostial septal perforating artery stenosis in the LAD, 20% areas of irregularity in the mid AV groove circumflex, and smooth 30-40% narrowing in the mid nondominant RCA.  Lennette Bihari, MD, [facility] 01/07/2015 11:41  AM   Echo Study Conclusions from March 2016  - Left ventricle: The cavity size was mildly dilated. Wall thickness was increased in a pattern of mild LVH. Systolic function was severely reduced. The estimated ejection fraction was in the range of 20% to 25%. Diffuse hypokinesis. Features are consistent with a pseudonormal left ventricular filling pattern, with concomitant abnormal relaxation and increased filling pressure (grade 2 diastolic dysfunction). Doppler parameters are consistent with high ventricular filling pressure. - Aortic valve: There was trivial regurgitation. - Aortic root: The aortic root was mildly dilated. - Mitral valve: There was moderate regurgitation. - Left atrium: The atrium was severely dilated. - Pericardium, extracardiac: A small pericardial effusion was identified. There was a left pleural effusion.  Impressions:  - Severe global reduction in LV function; grade 2 diastolic dysfunction with elevated LV filling pressure; severe LAE; moderate MR; small pericardial effusion; left pleural effusion.   Assessment/Plan:  1. Newly diagnosed NICM with systolic and diastolic heart failure - looks good clinically. Explained the need to maximize his  medicines over the next 3 months. Repeat echo in June. Restrict salt. Explained when to use extra lasix. See Dr. Herbie Baltimore back. Coreg is increased today to 9.75 mg BID. Ok to return to work next week.   2. Uncontrolled HTN -  BP looks ok.  3. Uncontrolled DM  4. Coronary calcification noted on CT   Current medicines are reviewed with the patient today.  The patient does not have concerns regarding medicines other than what has been noted above.  The following changes have been made:  See above.  Labs/ tests ordered today include:    Orders Placed This Encounter  Procedures  . Brain natriuretic peptide  . Basic metabolic panel     Disposition:   FU with Dr. Herbie Baltimore  in 3 weeks  Patient is agreeable to this plan and will call if any problems develop in the interim.   Signed: Rosalio Macadamia, RN, ANP-C 01/15/2015 9:26 AM  [facility] [address] [place], [place]  [address] Phone: [phone] Fax: [phone]

## 2015-01-15 NOTE — Patient Instructions (Signed)
We will be checking the following labs today BMET, BNP  Stay on your current medicines but I am increasing the Coreg to 9.75 mg twice a day (this will be one and half pills twice a day  Restrict salt  Weigh daily - extra Lasix if needed for weight gain of 2 to 3 pounds over one night  See Dr. Herbie Baltimore in 2 to 3 weeks  May return to work next week  Plan will be to maximize your medicines over the next 3 months and then repeat an echocardiogram  Call the [facility] office at [phone] if you have any questions, problems or concerns.

## 2015-01-22 ENCOUNTER — Encounter: Payer: Self-pay | Admitting: *Deleted

## 2015-02-06 ENCOUNTER — Ambulatory Visit (INDEPENDENT_AMBULATORY_CARE_PROVIDER_SITE_OTHER): Payer: Self-pay | Admitting: Cardiology

## 2015-02-06 ENCOUNTER — Encounter: Payer: Self-pay | Admitting: Cardiology

## 2015-02-06 VITALS — BP 150/90 | HR 75 | Ht 71.0 in | Wt 156.0 lb

## 2015-02-06 DIAGNOSIS — I10 Essential (primary) hypertension: Secondary | ICD-10-CM

## 2015-02-06 DIAGNOSIS — I2510 Atherosclerotic heart disease of native coronary artery without angina pectoris: Secondary | ICD-10-CM

## 2015-02-06 DIAGNOSIS — I5042 Chronic combined systolic (congestive) and diastolic (congestive) heart failure: Secondary | ICD-10-CM

## 2015-02-06 DIAGNOSIS — I428 Other cardiomyopathies: Secondary | ICD-10-CM

## 2015-02-06 DIAGNOSIS — I429 Cardiomyopathy, unspecified: Secondary | ICD-10-CM

## 2015-02-06 MED ORDER — LISINOPRIL 40 MG PO TABS: 40.0000 mg | ORAL_TABLET | Freq: Every day | ORAL | Status: DC

## 2015-02-06 MED ORDER — CARVEDILOL 12.5 MG PO TABS: 12.5000 mg | ORAL_TABLET | Freq: Two times a day (BID) | ORAL | Status: DC

## 2015-02-06 MED ORDER — ATORVASTATIN CALCIUM 40 MG PO TABS: 40.0000 mg | ORAL_TABLET | Freq: Every day | ORAL | Status: DC

## 2015-02-06 MED ORDER — FUROSEMIDE 40 MG PO TABS: 40.0000 mg | ORAL_TABLET | Freq: Every day | ORAL | Status: DC

## 2015-02-06 NOTE — Progress Notes (Signed)
PATIENT: Jerry Bean MRN: [id number] DOB: 11-Sep-1956 PCP: Egbert Garibaldi, NP  Clinic Note: Chief Complaint  Patient presents with  . 3 week office visit    wearing life vest , labs, no chest discomfort , no sob , no swelling  . Cardiomyopathy   HPI: Jerry Bean is a 59 y.o. male with a PMH below who presents today for second post hospital followup after initial diagnosis of nonischemic cardiomyopathy. In addition to his cardiomyopathy he has uncontrolled diabetes type 2 and GERD. He was admitted March of this year (2016) with increasing dyspnea, worse with exertion as well as orthopnea and PND. He was seen by his PCP and referred to the emergency room for admission. Echocardiogram revealed EF of 20-25%. Cardiac catheterization did not show any evidence of significant CAD. He was started on aggressive CHF medications and was discharged with LifeVest.  Interval History: Jerry Bean presents today feeling "100% better "he feels mentally, emotionally and physically as well he as he has felt a long time. He denies any heart failure symptoms of PND, orthopnea or edema. No rapid irregular heartbeat/palpitations. He never did have any anginal symptoms of chest tightness pressure rest or exertion.  Cardiovascular ROS: no chest pain or dyspnea on exertion negative for - chest pain, edema, irregular heartbeat, loss of consciousness, murmur, orthopnea, palpitations, paroxysmal nocturnal dyspnea, rapid heart rate, shortness of breath or TIA/amaurosis fugax; syncope or near syncope  He has started to walk gradually now and has been back to work her up 3 weeks. He would really like to get back to playing golf and doing full exercise.   Past Medical History  Diagnosis Date  . Diabetes mellitus type 2, uncontrolled   . Essential hypertension   . GERD (gastroesophageal reflux disease)     Prior Cardiac Evaluation and Past Surgical History: Past Surgical History  Procedure Laterality Date  .  Transthoracic echocardiogram  01/05/2015    EF 20-25%, diffuse Hypokinesis, Grade 2 DD, mild Ao root dilation, Mod MR, Severe LA dilation, Trivial AI  . Left and right heart catheterization with coronary angiogram N/A 01/07/2015    Procedure: LEFT AND RIGHT HEART CATHETERIZATION WITH CORONARY ANGIOGRAM;  Surgeon: Lennette Bihari, MD;  Location: [facility] CATH LAB;  Service: Cardiovascular;  Laterality: N/A;  ANGIOGRAPHY:   Left main: Very large at least 6-7 mm left main which was normal and bifurcated into a large LAD and large dominant left circumflex coronary artery.  LAD: Large caliber vessel which gave rise to 4 diagonal branches and extended to and wrapped around the apex. The proximal septal perforating artery had 70% ostial smooth narrowing. There were minimal luminal irregularities of the LAD without obstructive disease.   Left circumflex: Large dominant vessel that gave rise to 2 major marginal branches, PDA and posterolateral branch. There was mild luminal narrowing of 20% in the AV groove circumflex between the OM1 and known 2 vessel..   Right coronary artery: Small nondominant vessel that had smooth 30% narrowing in the mid segment after its bifurcation.  Left ventriculography revealed a dilated left ventricle with diffuse severe global hypocontractility with more severe hypo-to akinesis involving the distal anterolateral wall. Ejection fraction is approximately 15 to less than 20%.  Echo Study Conclusions from [facility] 2016: Severe global reduction in LV function (~20-25%); grade 2 diastolic dysfunction with elevated LV filling pressure; severe LAE; moderate MR; small pericardial effusion; left pleural effusion.  No Known Allergies  Current Outpatient Prescriptions  Medication Sig Dispense Refill  . aspirin  EC 81 MG EC tablet Take 1 tablet (81 mg total) by mouth daily. 30 tablet 11  . atorvastatin (LIPITOR) 40 MG tablet Take 1 tablet (40 mg total) by mouth daily at 6 PM. 90 tablet 3  .  furosemide (LASIX) 40 MG tablet Take 1 tablet (40 mg total) by mouth daily. 30 tablet 3  . glipiZIDE (GLUCOTROL) 10 MG tablet Take 10 mg by mouth 2 (two) times daily before a meal.    . lisinopril (PRINIVIL,ZESTRIL) 40 MG tablet Take 1 tablet (40 mg total) by mouth daily. 90 tablet 3  . metFORMIN (GLUCOPHAGE) 1000 MG tablet Take 1,000 mg by mouth 2 (two) times daily with a meal.    . omeprazole (PRILOSEC) 20 MG capsule Take 20 mg by mouth daily.    Marland Kitchen VIAGRA 100 MG tablet Take 100 mg by mouth as needed for erectile dysfunction (pt has not tried medication yet).   0  . carvedilol (COREG) 12.5 MG tablet Take 1 tablet (12.5 mg total) by mouth 2 (two) times daily. 180 tablet 3   No current facility-administered medications for this visit.   History  Substance Use Topics  . Smoking status: Never Smoker   . Smokeless tobacco: Not on file  . Alcohol Use: No    family history includes AAA (abdominal aortic aneurysm) (age of onset: 41) in his brother; Diabetes in his mother.  ROS: A comprehensive Review of Systems - was performed Review of Systems  Constitutional: Negative for malaise/fatigue.  Respiratory: Negative for cough, shortness of breath and wheezing.   Cardiovascular: Negative.  Negative for claudication.       Per HPI  Gastrointestinal: Negative for blood in stool and melena.  Genitourinary: Negative for hematuria.  Musculoskeletal: Negative.   Neurological: Negative for dizziness.  Endo/Heme/Allergies: Does not bruise/bleed easily.  Psychiatric/Behavioral: Negative.   All other systems reviewed and are negative.  PHYSICAL EXAM BP 150/90 mmHg  Pulse 75  Ht 5\' 11"  (1.803 m)  Wt 156 lb (70.761 kg)  BMI 21.77 kg/m2 General: Pleasant. Well developed, well nourished and in no acute distress.  HEENT: [place]/AT, PERRL, EOMI Neck: Supple, no JVD, carotid bruits, or masses noted.  Cardiac: RRR. No murmurs, rubs, or gallops. No edema.  Respiratory: Lungs are clear to auscultation  bilaterally with normal work of breathing.  GI: Soft and nontender.  MS: No deformity or atrophy. Gait and ROM intact.  Skin: Warm and dry. Color is normal.  Neuro: Strength and sensation are intact and no gross focal deficits noted.  Psych: Alert, appropriate and with normal affect.   Adult ECG Report  Rate: 75 ;  Rhythm: normal sinus rhythm and LVH with repolarization changes & mild QRS widening, nn-specific ST-T wave abnormalities  QRS Axis: -28 ;  PR Interval: 180 ;  QRS Duration: 120 ; QTc: 424; Voltages: ~LVH by Lead I.  Narrative Interpretation: stable EKG  Recent Labs:  Lab Results  Component Value Date   CREATININE 1.17 01/15/2015   Lab Results  Component Value Date   CHOL 154 01/04/2015   HDL 44 01/04/2015   LDLCALC 90 01/04/2015   TRIG 102 01/04/2015   CHOLHDL 3.5 01/04/2015    ASSESSMENT / PLAN: Grossly stable Aleatha Borer has some nausea cardiomyopathy. He really seems to have only class I symptoms.  Problem List Items Addressed This Visit    Chronic combined systolic and diastolic heart failure, NYHA class 1 (Chronic)    Really class I symptoms despite severe EF on echocardiogram.  Symptoms don't really go along with such a significant reduced EF. Stable dose of Lasix and he understands a sliding-scale concert. He is on a good dose of carvedilol and max dose of ACE inhibitor. We do have room to increase his carvedilol dose to 12 twice a day which we'll do in a tapered manner.  Reassess EF by echocardiogram in late June and see him back in July. Pending the results of the echocardiogram we'll decide what to do with his LifeVest. - as he has not had any arrhythmias or any symptoms of palpitations rapid heart beats , I think we need you to discontinue it 3 months regardless. I would consider referral toEP for consideration of the ICD pending the results of his echo as well.      Relevant Medications   atorvastatin (LIPITOR) tablet   furosemide (LASIX) tablet    lisinopril (PRINIVIL,ZESTRIL) tablet   carvedilol (COREG) tablet   Coronary artery calcification seen on CAT scan    Minimal circumflex disease from catheterization. He is on statin aspirin as well as his CHF medications.      Relevant Medications   atorvastatin (LIPITOR) tablet   furosemide (LASIX) tablet   lisinopril (PRINIVIL,ZESTRIL) tablet   carvedilol (COREG) tablet   NICM (nonischemic cardiomyopathy) - Primary (Chronic)    Very stable with no active symptoms. Still not clear what the etiology is. He did not have any preceding symptoms of potential viral infections. He has never been alcoholic. This could just be long-standing diabetes hypertension related. Amil Amen reassessment of his EF on adequate medications. May want to consider cardiac MRI to evaluate for any type of infiltrative disease.  He is on stable regimen as indicated below.      Relevant Medications   atorvastatin (LIPITOR) tablet   furosemide (LASIX) tablet   lisinopril (PRINIVIL,ZESTRIL) tablet   carvedilol (COREG) tablet   Other Relevant Orders   EKG 12-Lead   2D Echocardiogram without contrast   Uncontrolled hypertension (Chronic)     Infuse-a-Port control. This may be the reason for his cardiomyopathy. We'll titrate the carvedilol. Consider adding amlodipine in the future.      Relevant Medications   atorvastatin (LIPITOR) tablet   furosemide (LASIX) tablet   lisinopril (PRINIVIL,ZESTRIL) tablet   carvedilol (COREG) tablet   Other Relevant Orders   EKG 12-Lead   2D Echocardiogram without contrast      Meds ordered this encounter  Medications  . atorvastatin (LIPITOR) 40 MG tablet    Sig: Take 1 tablet (40 mg total) by mouth daily at 6 PM.    Dispense:  90 tablet    Refill:  3  . furosemide (LASIX) 40 MG tablet    Sig: Take 1 tablet (40 mg total) by mouth daily.    Dispense:  30 tablet    Refill:  3  . lisinopril (PRINIVIL,ZESTRIL) 40 MG tablet    Sig: Take 1 tablet (40 mg total) by mouth  daily.    Dispense:  90 tablet    Refill:  3  . carvedilol (COREG) 12.5 MG tablet    Sig: Take 1 tablet (12.5 mg total) by mouth 2 (two) times daily.    Dispense:  180 tablet    Refill:  3    Followup: July 2016  DAVID W. Herbie Baltimore, M.D., M.S. Interventional Cardiolgy [facility]

## 2015-02-06 NOTE — Patient Instructions (Addendum)
Take Carvedilol-----For the next 2 weeks take 1 and 1/2 tablet in the morning , then 2 tablets in the evening. After  2 weeks take 2 tablet twice a day until bottle is completed. Start carvedilol 12.5 mg -1 tablet twice a day  schedule late June-July 2016.Your physician has requested that you have an echocardiogram. Echocardiography is a painless test that uses sound waves to create images of your heart. It provides your doctor with information about the size and shape of your heart and how well your heart's chambers and valves are working. This procedure takes approximately one hour. There are no restrictions for this procedure.    Your physician wants you to follow-up in July 2016 Dr harding- 30 min appointment.  You will receive a reminder letter in the mail two months in advance. If you don't receive a letter, please call our office to schedule the follow-up appointment.

## 2015-02-08 ENCOUNTER — Encounter: Payer: Self-pay | Admitting: Cardiology

## 2015-02-08 NOTE — Assessment & Plan Note (Signed)
Infuse-a-Port control. This may be the reason for his cardiomyopathy. We'll titrate the carvedilol. Consider adding amlodipine in the future.

## 2015-02-08 NOTE — Assessment & Plan Note (Signed)
Really class I symptoms despite severe EF on echocardiogram.  Symptoms don't really go along with such a significant reduced EF. Stable dose of Lasix and he understands a sliding-scale concert. He is on a good dose of carvedilol and max dose of ACE inhibitor. We do have room to increase his carvedilol dose to 12 twice a day which we'll do in a tapered manner.  Reassess EF by echocardiogram in late June and see him back in July. Pending the results of the echocardiogram we'll decide what to do with his LifeVest. - as he has not had any arrhythmias or any symptoms of palpitations rapid heart beats , I think we need you to discontinue it 3 months regardless. I would consider referral toEP for consideration of the ICD pending the results of his echo as well.

## 2015-02-08 NOTE — Assessment & Plan Note (Signed)
Minimal circumflex disease from catheterization. He is on statin aspirin as well as his CHF medications.

## 2015-02-08 NOTE — Assessment & Plan Note (Signed)
Very stable with no active symptoms. Still not clear what the etiology is. He did not have any preceding symptoms of potential viral infections. He has never been alcoholic. This could just be long-standing diabetes hypertension related. Amil Amen reassessment of his EF on adequate medications. May want to consider cardiac MRI to evaluate for any type of infiltrative disease.  He is on stable regimen as indicated below.

## 2015-02-10 ENCOUNTER — Telehealth: Payer: Self-pay | Admitting: Cardiology

## 2015-02-10 NOTE — Telephone Encounter (Signed)
Received FMLA form from [employer] for Dr Herbie Baltimore to review and sign.  FMLA form given to Ermalene Searing, RN for Dr Herbie Baltimore to review and sign and return to me.

## 2015-02-13 NOTE — Telephone Encounter (Signed)
FMLA FORM COMPLETED AND GIVEN TO LYNN PRUETT

## 2015-02-14 ENCOUNTER — Other Ambulatory Visit (HOSPITAL_COMMUNITY): Payer: Self-pay

## 2015-02-14 NOTE — Telephone Encounter (Signed)
Received signed FMLA form from Dr Herbie Baltimore.  Unable to reach patient to inform that FMLA ready for pick up.  Left message on phone.

## 2015-02-14 NOTE — Telephone Encounter (Signed)
Was able to reach patient.  Notified him that FMLA forms ready for pick up.

## 2015-03-24 ENCOUNTER — Other Ambulatory Visit: Payer: Self-pay | Admitting: Internal Medicine

## 2015-04-03 ENCOUNTER — Telehealth: Payer: Self-pay | Admitting: Cardiology

## 2015-04-03 MED ORDER — FUROSEMIDE 40 MG PO TABS: 40.0000 mg | ORAL_TABLET | Freq: Every day | ORAL | Status: DC

## 2015-04-03 NOTE — Telephone Encounter (Signed)
Refill submitted to patient's preferred pharmacy. Informed patient. Pt voiced understanding, no other stated concerns at this time.  

## 2015-04-03 NOTE — Telephone Encounter (Signed)
°  1. Which medications need to be refilled? Lasik-- Needs a new prescription sent to [facility]   2. Which pharmacy is medication to be sent to? [facility] on [address]   3. Do they need a 30 day or 90 day supply? 90   4. Would they like a call back once the medication has been sent to the pharmacy? Yes

## 2015-04-30 ENCOUNTER — Other Ambulatory Visit (HOSPITAL_COMMUNITY): Payer: Self-pay

## 2015-04-30 ENCOUNTER — Ambulatory Visit (HOSPITAL_COMMUNITY): Payer: Self-pay

## 2015-04-30 ENCOUNTER — Ambulatory Visit (HOSPITAL_COMMUNITY)
Admission: RE | Admit: 2015-04-30 | Discharge: 2015-04-30 | Disposition: A | Discharge status: Home / Self Care | Payer: Self-pay | Source: Ambulatory Visit | Attending: Cardiology | Admitting: Cardiology

## 2015-04-30 ENCOUNTER — Telehealth: Payer: Self-pay | Admitting: *Deleted

## 2015-04-30 DIAGNOSIS — K219 Gastro-esophageal reflux disease without esophagitis: Secondary | ICD-10-CM | POA: Insufficient documentation

## 2015-04-30 DIAGNOSIS — I071 Rheumatic tricuspid insufficiency: Secondary | ICD-10-CM | POA: Insufficient documentation

## 2015-04-30 DIAGNOSIS — I428 Other cardiomyopathies: Secondary | ICD-10-CM

## 2015-04-30 DIAGNOSIS — E119 Type 2 diabetes mellitus without complications: Secondary | ICD-10-CM | POA: Insufficient documentation

## 2015-04-30 DIAGNOSIS — I10 Essential (primary) hypertension: Secondary | ICD-10-CM

## 2015-04-30 DIAGNOSIS — I429 Cardiomyopathy, unspecified: Secondary | ICD-10-CM

## 2015-04-30 HISTORY — PX: TRANSTHORACIC ECHOCARDIOGRAM: SHX275

## 2015-04-30 NOTE — Telephone Encounter (Signed)
ECHO TECH- TAMMIE INFORMED RN THAT PATIENT WAS WEARING LIFE VEST  RN CALLED TO CONFIRM WITH PATIENT , AND INSTRUCTED TO CONTINUE TO WEAR VEST UNTIL HE SEES DR HARDING OR CALL FROM OFFICE WITH RESULTS APPOINTMENT IS MADE FOR 05/26/15 10:15 AM. PATIENT VERBALIZED UNDERSTANDING.

## 2015-05-26 ENCOUNTER — Ambulatory Visit (INDEPENDENT_AMBULATORY_CARE_PROVIDER_SITE_OTHER): Payer: Self-pay | Admitting: Cardiology

## 2015-05-26 ENCOUNTER — Encounter: Payer: Self-pay | Admitting: Cardiology

## 2015-05-26 VITALS — BP 158/96 | HR 80 | Ht 71.0 in | Wt 161.0 lb

## 2015-05-26 DIAGNOSIS — I428 Other cardiomyopathies: Secondary | ICD-10-CM

## 2015-05-26 DIAGNOSIS — I2510 Atherosclerotic heart disease of native coronary artery without angina pectoris: Secondary | ICD-10-CM

## 2015-05-26 DIAGNOSIS — I429 Cardiomyopathy, unspecified: Secondary | ICD-10-CM

## 2015-05-26 DIAGNOSIS — I5042 Chronic combined systolic (congestive) and diastolic (congestive) heart failure: Secondary | ICD-10-CM

## 2015-05-26 DIAGNOSIS — I10 Essential (primary) hypertension: Secondary | ICD-10-CM

## 2015-05-26 MED ORDER — CARVEDILOL 25 MG PO TABS: 25.0000 mg | ORAL_TABLET | Freq: Two times a day (BID) | ORAL | Status: DC

## 2015-05-26 NOTE — Progress Notes (Signed)
PATIENT: Jerry Bean MRN: [id number] DOB: 05-Aug-1956 PCP: Egbert Garibaldi, NP  Clinic Note: Chief Complaint  Patient presents with  . Follow-up    no chest pain, no shortness of breath, no edema, no pain in legs, no cramping in legs, no lightheadedness, no dizziness  . Cardiomyopathy    Nonischemic   HPI: Jerry Bean is a 59 y.o. male with a PMH below who presents today for second post hospital followup after initial diagnosis of nonischemic cardiomyopathy. In addition to his cardiomyopathy he has uncontrolled diabetes type 2 and GERD. He was admitted March of this year (2016) with increasing dyspnea, worse with exertion as well as orthopnea and PND. He was seen by his PCP and referred to the emergency room for admission. Echocardiogram revealed EF of 20-25%. Cardiac catheterization did not show any evidence of significant CAD. He was started on aggressive CHF medications and was discharged with LifeVest that was subsequent return back in because of issues with discomfort..  Studies reviewed:  Repeat echocardiogram 04/30/2015: Mild improvement of EF up to 25-30% with severe diffuse HK. Improved diastolic function to grade 1. Moderate LA dilation  Interval History: As was the case last visit, Jerry Bean is doing very well. He is not having any heart failure symptoms of PND orthopnea, edema or even significant exertional dyspnea. He is exercising regularly doing cardiovascular exercise and weight. He has dramatically changed his diet.  He is now living with his sister, who helps him arrange his medications and ensures that his diet is healthy.  No rapid irregular heartbeat/palpitations. He never did have any anginal symptoms of chest tightness pressure rest or exertion.  Cardiovascular ROS: no chest pain or dyspnea on exertion negative for - chest pain, edema, irregular heartbeat, loss of consciousness, murmur, orthopnea, palpitations, paroxysmal nocturnal dyspnea, rapid heart rate, shortness  of breath or TIA/amaurosis fugax; syncope or near syncope  He has started going back to playing golf.    Past Medical History  Diagnosis Date  . Diabetes mellitus type 2, uncontrolled   . Essential hypertension   . GERD (gastroesophageal reflux disease)   . Nonischemic cardiomyopathy March 2016    Mild CAD only with 70% SP1. Echo: EF 20-25%. GR 2 DD, elevated LVEDP. Severe LAE, moderate MR    Prior Cardiac Evaluation and Past Surgical History: Past Surgical History  Procedure Laterality Date  . Transthoracic echocardiogram  01/05/2015    EF 20-25%, diffuse Hypokinesis, Grade 2 DD, mild Ao root dilation, Mod MR, Severe LA dilation, Trivial AI  . Left and right heart catheterization with coronary angiogram N/A 01/07/2015    Procedure: LEFT AND RIGHT HEART CATHETERIZATION WITH CORONARY ANGIOGRAM;  Surgeon: Lennette Bihari, MD;  Location: [facility] CATH LAB;  Service: Cardiovascular;  very large left main. Dominant circumflex with 20% stenosis. LAD SP1 has 70% area for diagonal branches. LAD wraps the apex. Small nondominant right. EF by LV gram 15-20% with hypo-akinesis of the distal anterolateral wall.  No Known Allergies  Current Outpatient Prescriptions  Medication Sig Dispense Refill  . aspirin EC 81 MG EC tablet Take 1 tablet (81 mg total) by mouth daily. 30 tablet 11  . atorvastatin (LIPITOR) 40 MG tablet Take 1 tablet (40 mg total) by mouth daily at 6 PM. 90 tablet 3  . furosemide (LASIX) 40 MG tablet Take 1 tablet (40 mg total) by mouth daily. 90 tablet 1  . glipiZIDE (GLUCOTROL) 10 MG tablet Take 10 mg by mouth 2 (two) times daily before a meal.    .  lisinopril (PRINIVIL,ZESTRIL) 40 MG tablet Take 1 tablet (40 mg total) by mouth daily. 90 tablet 3  . metFORMIN (GLUCOPHAGE) 1000 MG tablet Take 1,000 mg by mouth 2 (two) times daily with a meal.    . omeprazole (PRILOSEC) 20 MG capsule Take 20 mg by mouth daily.    Marland Kitchen VIAGRA 100 MG tablet Take 100 mg by mouth as needed for erectile dysfunction  (pt has not tried medication yet).   0  . carvedilol (COREG) 25 MG tablet Take 1 tablet (25 mg total) by mouth 2 (two) times daily. 180 tablet 3   No current facility-administered medications for this visit.   History   Social History  . Marital Status: Married    Spouse Name: N/A  . Number of Children: N/A  . Years of Education: N/A   Occupational History  . security guard    Social History Main Topics  . Smoking status: Never Smoker   . Smokeless tobacco: Not on file  . Alcohol Use: No  . Drug Use: No  . Sexual Activity: Not on file   Other Topics Concern  . None   Social History Narrative   He is recently in the process of undergoing a divorce. He is now currently living with his sister who is coming in today. They are actually living in [place]. He is still working on his graduate degree in religion/religious counseling. 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     family history includes AAA (abdominal aortic aneurysm) (age of onset: 64) in his brother; Diabetes in his mother.  ROS: A comprehensive Review of Systems - was performed Review of Systems  Constitutional: Negative for malaise/fatigue.  Respiratory: Negative for cough, shortness of breath and wheezing.   Cardiovascular: Negative.  Negative for claudication.       Per HPI  Gastrointestinal: Negative for blood in stool and melena.  Genitourinary: Negative for hematuria.  Musculoskeletal: Negative.   Neurological: Negative for dizziness.  Endo/Heme/Allergies: Does not bruise/bleed easily.  Psychiatric/Behavioral: Negative.   All other systems reviewed and are negative.  PHYSICAL EXAM BP 158/96 mmHg  Pulse 80  Ht  (1.803 m)  Wt 73.029 kg (161 lb)  BMI 22.46 kg/m2 General: Pleasant. Well developed, well nourished and in no acute distress.  HEENT: /AT, PERRL, EOMI Neck: Supple, no JVD, carotid bruits, or masses noted.  Cardiac: RRR. No murmurs, rubs, or  gallops. No edema.  Respiratory: Lungs are clear to auscultation bilaterally with normal work of breathing.  GI: Soft and nontender.  MS: No deformity or atrophy. Gait and ROM intact.  Skin: Warm and dry. Color is normal.  Neuro: Strength and sensation are intact and no gross focal deficits noted.  Psych: Alert, appropriate and with normal affect.   Adult ECG Report - not checked   Recent Labs:  Lab Results  Component Value Date   CREATININE 1.17 01/15/2015   Lab Results  Component Value Date   CHOL 154 01/04/2015   HDL 44 01/04/2015   LDLCALC 90 01/04/2015   TRIG 102 01/04/2015   CHOLHDL 3.5 01/04/2015    ASSESSMENT / PLAN: Very stable. NYHA class I CHF symptoms  Problem List Items Addressed This Visit    Chronic combined systolic and diastolic heart failure, NYHA class 1 - Primary (Chronic)    He is doing remarkably well. Monitoring his daily weights. Watching his diet and exercise. He says he is doing the sliding scale Lasix, but  is only had to take an additional dose maybe 3 or 4 times in the last couple months. He is active and exercising. He is on lisinopril 40 mg daily and 12.5 L twice a day Coreg.  In the future, I would like to consider possibility of switching over to [facility] provided his EF remains down. For now the next several be to titrate carvedilol up to 25 mg twice a day. Once he can tolerate this, we will consider switching from lisinopril to [facility]. Continue current standing dose of Lasix with when necessary sliding scale.      Relevant Medications   carvedilol (COREG) 25 MG tablet   Coronary artery calcification seen on CAT scan    No significant/nonobstructive CAD on cardiac catheterization. On aspirin and statin as well as CHF meds.      Relevant Medications   carvedilol (COREG) 25 MG tablet   NICM (nonischemic cardiomyopathy) (Chronic)    Excellent symptomatic control as indicated above. Clearly nonischemic etiology. Not sure what the  true etiology would be. It is now been over 3 months since initial diagnosis. As we had discussed earlier, I would like to refer him to Dr. Vida Roller physiology to discuss pros and cons of ICD placement for primary prophylaxis      Relevant Medications   carvedilol (COREG) 25 MG tablet   Uncontrolled hypertension (Chronic)    Titrate up carvedilol 25 mg twice a day. This certainly has room to convert from ACE inhibitor to [place]. Also consider adding calcium channel blocker      Relevant Medications   carvedilol (COREG) 25 MG tablet      Meds ordered this encounter  Medications  . carvedilol (COREG) 25 MG tablet    Sig: Take 1 tablet (25 mg total) by mouth 2 (two) times daily.    Dispense:  180 tablet    Refill:  3   PATIENT INSTRUCTIONS  Taper off carvedilol as follows  TART  CARVEDILOL 25 MG  IN THE EVENING  AND 12.5 MG    IN THE MORNING FOR 6 DAYS THEN GO TO 25 MG TWICE A DAY    NO OTHER CHANGE WIT MEDICATIONS.  Send lifevest back to company  Your physician recommends that you schedule a follow-up appointment in Dr [employer] or EP CLINIC -discuss defib implant.  Your physician wants you to follow-up in 6 MONTHS DR Kateline Kinkade-- 30 MIN APPOINTMENT.   Gagan Dillion W. Herbie Baltimore, M.D., M.S. Interventional Cardiolgy [facility]

## 2015-05-26 NOTE — Patient Instructions (Addendum)
START  CARVEDILOL 25 MG  IN THE EVENING  AND 12.5 MG   IN THE MORNING FOR 6 DAYS THEN GO TO 25 MG TWICE A DAY    NO OTHER CHANGE WIT MEDICATIONS.  Send lifevest back to company  Your physician recommends that you schedule a follow-up appointment in Dr [employer] or EP CLINIC -discuss defib implant.  Your physician wants you to follow-up in 6 MONTHS DR HARDING-- 30 MIN APPOINTMENT.   You will receive a reminder letter in the mail two months in advance. If you don't receive a letter, please call our office to schedule the follow-up appointment.

## 2015-05-28 ENCOUNTER — Encounter: Payer: Self-pay | Admitting: Cardiology

## 2015-05-28 NOTE — Assessment & Plan Note (Signed)
He is doing remarkably well. Monitoring his daily weights. Watching his diet and exercise. He says he is doing the sliding scale Lasix, but is only had to take an additional dose maybe 3 or 4 times in the last couple months. He is active and exercising. He is on lisinopril 40 mg daily and 12.5 L twice a day Coreg.  In the future, I would like to consider possibility of switching over to [facility] provided his EF remains down. For now the next several be to titrate carvedilol up to 25 mg twice a day. Once he can tolerate this, we will consider switching from lisinopril to [facility]. Continue current standing dose of Lasix with when necessary sliding scale.

## 2015-05-28 NOTE — Assessment & Plan Note (Signed)
Excellent symptomatic control as indicated above. Clearly nonischemic etiology. Not sure what the true etiology would be. It is now been over 3 months since initial diagnosis. As we had discussed earlier, I would like to refer him to Dr. Vida Roller physiology to discuss pros and cons of ICD placement for primary prophylaxis

## 2015-05-28 NOTE — Assessment & Plan Note (Signed)
No significant/nonobstructive CAD on cardiac catheterization. On aspirin and statin as well as CHF meds.

## 2015-05-28 NOTE — Assessment & Plan Note (Addendum)
Titrate up carvedilol 25 mg twice a day. This certainly has room to convert from ACE inhibitor to [place]. Also consider adding calcium channel blocker

## 2015-06-06 ENCOUNTER — Encounter: Payer: Self-pay | Admitting: Cardiology

## 2015-06-13 ENCOUNTER — Encounter: Payer: Self-pay | Admitting: Cardiology

## 2015-10-20 ENCOUNTER — Telehealth: Payer: Self-pay | Admitting: Cardiology

## 2015-10-20 NOTE — Telephone Encounter (Signed)
°*  STAT* If patient is at the pharmacy, call can be transferred to refill team.   1. Which medications need to be refilled? (please list name of each medication and dose if known) Furosemide   2. Which pharmacy/location (including street and city if local pharmacy) is medication to be sent to?[facility] in [facility] [place] [facility]   [address]  3. Do they need a 30 day or 90 day supply? 90

## 2015-10-21 MED ORDER — FUROSEMIDE 40 MG PO TABS
40.0000 mg | ORAL_TABLET | Freq: Every day | ORAL | Status: DC
Start: 2015-10-21 — End: 2015-12-11

## 2015-10-21 NOTE — Telephone Encounter (Signed)
Rx send into pt pharmacy 

## 2015-12-11 ENCOUNTER — Encounter: Payer: Self-pay | Admitting: Cardiology

## 2015-12-11 ENCOUNTER — Ambulatory Visit (INDEPENDENT_AMBULATORY_CARE_PROVIDER_SITE_OTHER): Payer: Self-pay | Admitting: Cardiology

## 2015-12-11 VITALS — BP 188/120 | HR 70 | Ht 71.0 in | Wt 168.0 lb

## 2015-12-11 DIAGNOSIS — I10 Essential (primary) hypertension: Secondary | ICD-10-CM

## 2015-12-11 DIAGNOSIS — R0602 Shortness of breath: Secondary | ICD-10-CM

## 2015-12-11 DIAGNOSIS — I2510 Atherosclerotic heart disease of native coronary artery without angina pectoris: Secondary | ICD-10-CM

## 2015-12-11 DIAGNOSIS — I429 Cardiomyopathy, unspecified: Secondary | ICD-10-CM

## 2015-12-11 DIAGNOSIS — E1101 Type 2 diabetes mellitus with hyperosmolarity with coma: Secondary | ICD-10-CM

## 2015-12-11 DIAGNOSIS — I5042 Chronic combined systolic (congestive) and diastolic (congestive) heart failure: Secondary | ICD-10-CM

## 2015-12-11 DIAGNOSIS — R0609 Other forms of dyspnea: Secondary | ICD-10-CM

## 2015-12-11 DIAGNOSIS — I428 Other cardiomyopathies: Secondary | ICD-10-CM

## 2015-12-11 DIAGNOSIS — Z794 Long term (current) use of insulin: Secondary | ICD-10-CM

## 2015-12-11 DIAGNOSIS — Z79899 Other long term (current) drug therapy: Secondary | ICD-10-CM

## 2015-12-11 MED ORDER — FUROSEMIDE 40 MG PO TABS
40.0000 mg | ORAL_TABLET | Freq: Every day | ORAL | Status: DC
Start: 2015-12-11 — End: 2018-10-02

## 2015-12-11 MED ORDER — SPIRONOLACTONE 25 MG PO TABS: 25.0000 mg | ORAL_TABLET | Freq: Every day | ORAL | Status: DC

## 2015-12-11 MED ORDER — SACUBITRIL-VALSARTAN 49-51 MG PO TABS: 1.0000 | ORAL_TABLET | Freq: Two times a day (BID) | ORAL | Status: DC

## 2015-12-11 MED ORDER — CARVEDILOL 25 MG PO TABS: 25.0000 mg | ORAL_TABLET | Freq: Two times a day (BID) | ORAL | Status: DC

## 2015-12-11 MED ORDER — ATORVASTATIN CALCIUM 40 MG PO TABS: 40.0000 mg | ORAL_TABLET | Freq: Every day | ORAL | Status: DC

## 2015-12-11 NOTE — Progress Notes (Signed)
PCP: Imelda Pillow, NP  Clinic Note: Chief Complaint  Patient presents with  . Follow-up    Has been out of blood pressure medications for the last week and half  . Cardiomyopathy    Nonischemic    HPI: Jerry Bean is a 60 y.o. male with a PMH below who presents today for six-month follow-up of severe nonischemic cardiomyopathy.  Zakai Gonyea was last seen in July 2016 - this was his second post hospital follow-up after his initial diagnosis of nonischemic cardiomyopathy - EF 20-25%. No coronary disease on catheterization.CHF medications, but has apparently not been as faithful with his medication regimen. He was supposed to have seen electrophysiology, but that appointment never happened. During that hospital stay he was diagnosed with uncontrolled type 2 diabetes as well as.  Recent Hospitalizations: none  Studies Reviewed:  * Echo: 04/30/2015 - Left ventricle: The cavity size was mildly dilated. Systolic function was severely reduced. The estimated ejection fraction was in the range of 25% to 30%. Severe diffuse hypokinesis with no identifiable regional variations. There was an increased relative contribution of atrial contraction to ventricular filling. Doppler parameters are consistent with abnormal left ventricular relaxation (grade 1 diastolic dysfunction). No evidence of thrombus. - Left atrium: The atrium was moderately dilated.   Interval History: Jerry Bean presents today really with no major complaints. He apparently has been out of his blood pressure medications for bout a week and a half now. Despite that, he feels "good without any major complaints. 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 He goes up and down several flights of stairs and walks the better part of a mile with each one of these rounds. He denies any PND, orthopnea or second significant  edema.  No chest pain or shortness of breath with rest or exertion. No palpitations, lightheadedness, dizziness, weakness or syncope/near syncope. No TIA/amaurosis fugax symptoms. No melena, hematochezia, hematuria, or epstaxis. No claudication.  ROS: A comprehensive was performed. Review of Systems  Eyes: Negative for blurred vision.  Respiratory: Negative for cough, sputum production, shortness of breath and wheezing.   Gastrointestinal: Positive for heartburn.  Musculoskeletal: Negative for myalgias, joint pain and falls.  Neurological: Negative for dizziness, weakness and headaches.  Psychiatric/Behavioral: Negative for memory loss.    Past Medical History  Diagnosis Date  . Diabetes mellitus type 2, uncontrolled ([place])   . Essential hypertension   . GERD (gastroesophageal reflux disease)   . Nonischemic cardiomyopathy [facility]) March 2016    Mild CAD only with 70% SP1. Echo: EF 20-25%. GR 2 DD, elevated LVEDP. Severe LAE, moderate MR;; b. Follow-up echo June 2016: EF 25-30%. Severe diffuse hypokinesis with no RWMA. Grade 1 diastolic dysfunction. Moderate LA dilation.    Past Surgical History  Procedure Laterality Date  . Transthoracic echocardiogram  01/05/2015    EF 20-25%, diffuse Hypokinesis, Grade 2 DD, mild Ao root dilation, Mod MR, Severe LA dilation, Trivial AI  . Left and right heart catheterization with coronary angiogram N/A 01/07/2015    Procedure: LEFT AND RIGHT HEART CATHETERIZATION WITH CORONARY ANGIOGRAM;  Surgeon: Troy Sine, MD;  Location[facility] C[facility];  Service: Cardiovascular;  very large left main. Dominant circumflex with 20% stenosis. LAD SP1 has 70% area for diagonal branches. LAD wraps the apex. Small nondominant right. EF by LV gram 15-20% with hypo-akinesis of the distal anterolateral wall.  Marland Kitchen  Transthoracic echocardiogram  04/30/2015    Mild LV dilation. Severely reduced systolic function. EF 25-30%. Diffuse HK with no RWMA. Only GR 1 DD. Moderate LA  dilation.   Prior to Admission medications   Medication Sig Start Date Taking? Authorizing Provider  aspirin EC 81 MG EC tablet Take 1 tablet (81 mg total) by mouth daily. 01/08/15 Yes Alexa Sherral Hammers, MD  atorvastatin (LIPITOR) 40 MG tablet Take 1 tablet (40 mg total) by mouth daily at 6 PM. 02/06/15 Yes Leonie Man, MD  carvedilol (COREG) 25 MG tablet Take 1 tablet (25 mg total) by mouth 2 (two) times daily. 05/26/15 Yes Leonie Man, MD  furosemide (LASIX) 40 MG tablet Take 1 tablet (40 mg total) by mouth daily. 10/21/15 Yes Leonie Man, MD  glipiZIDE (GLUCOTROL) 10 MG tablet Take 10 mg by mouth 2 (two) times daily before a meal.  Yes Historical Provider, MD  lisinopril (PRINIVIL,ZESTRIL) 40 MG tablet Take 1 tablet (40 mg total) by mouth daily. 02/06/15 Yes Leonie Man, MD  metFORMIN (GLUCOPHAGE) 1000 MG tablet Take 1,000 mg by mouth 2 (two) times daily with a meal.  Yes Historical Provider, MD  omeprazole (PRILOSEC) 20 MG capsule Take 20 mg by mouth daily.  Yes Historical Provider, MD  VIAGRA 100 MG tablet Take 100 mg by mouth as needed for erectile dysfunction (pt has not tried medication yet).  12/13/14 Yes Historical Provider, MD    No Known Allergies  Social History   Social History  . Marital Status: Married    Spouse Name: N/A  . Number of Children: N/A  . Years of Education: N/A   Occupational History  . security guard    Social History Main Topics  . Smoking status: Never Smoker   . Smokeless tobacco: None  . Alcohol Use: No  . Drug Use: No  . Sexual Activity: Not Asked   Other Topics Concern  . None   Social History Narrative   As of March 2016, he was undergoing a divorce. He is now currently living with his sister who is coming in today. They are actually living in [place]. He is still working on his graduate degree in religion/religious counseling. 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   Family History   Problem Relation Age of Onset  . Diabetes Mother   . AAA (abdominal aortic aneurysm) Brother 31    died of ruptured AAA    Wt Readings from Last 3 Encounters:  12/11/15 168 lb (76.204 kg)  05/26/15 161 lb (73.029 kg)  02/06/15 156 lb (70.761 kg)    PHYSICAL EXAM BP 188/120 mmHg  Pulse 70  Ht 5' 11"  (1.803 m)  Wt 168 lb (76.204 kg)  BMI 23.44 kg/m2 General: Pleasant. Well developed, well nourished and in no acute distress.  HEENT: [place]/AT, PERRL, EOMI Neck: Supple, no JVD, carotid bruits, or masses noted.  Cardiac: RRR. No murmurs, rubs, or gallops. No edema.  Respiratory: Lungs are clear to auscultation bilaterally with normal work of breathing.  GI: Soft and nontender.  MS: No deformity or atrophy. Gait and ROM intact.  Skin: Warm and dry. Color is normal.  Neuro: Strength and sensation are intact and no gross focal deficits noted.  Psych: Alert, appropriate and with normal affect.   Adult ECG Report  Rate: 70 ;  Rhythm: normal sinus rhythm and LVH with repolarization abnormalities.  ST depression with T wave inversions in leads V4-D6 as  well as II, III, aVF -- Consistent with repolarization.; Leftward deviation (-22); otherwise normal axis, intervals and durations.  Narrative Interpretation: Stable with no notable change. Repolarization changes are more pronounced  Other studies Reviewed: Additional studies/ records that were reviewed today include:  Recent Labs:  None since June    ASSESSMENT / PLAN: Problem List Items Addressed This Visit    Uncontrolled hypertension (Chronic)    Apparently he was at his lisinopril for about a week and half. He says he was taking his carvedilol. Since he has not been on his ACE inhibitor, I will take advantage of this opportunity to convert from ACE inhibitor to Entresto at least moderate dose. He met with Tommy Medal, RPH-CCP to discuss his ability to get Entresto without being on insurance. We've provided some samples  today. He will follow up with Erasmo Downer in 2 weeks to reassess. I will also start spironolactone 25 mg. If he continues to be hypertensive, we may need to further titrate Entresto and again consider adding amlodipine.  He will need a CMP checked prior to follow-up with Erasmo Downer      Relevant Medications   atorvastatin (LIPITOR) 40 MG tablet   carvedilol (COREG) 25 MG tablet   furosemide (LASIX) 40 MG tablet   sacubitril-valsartan (ENTRESTO) 49-51 MG   spironolactone (ALDACTONE) 25 MG tablet   Other Relevant Orders   EKG 12-Lead   Brain natriuretic peptide   Comprehensive metabolic panel   RESOLVED: SOB (shortness of breath)   Relevant Orders   Brain natriuretic peptide   Comprehensive metabolic panel   NICM (nonischemic cardiomyopathy) (HCC) (Chronic)    Symptoms are well-controlled. He doesn't have any significant problems as far as heart failure goes. He is even tolerating such a significantly elevated blood pressure. Adjusting medications accordingly with potentially converting to [facility]. He is on carvedilol. On standing dose of furosemide not having to take any when necessary dosing.  I would like to see his blood pressure being better controlled, and potentially on Entresto. We will then consider rechecking an echocardiogram prior to attempting to get him back to electrophysiology.      Relevant Medications   atorvastatin (LIPITOR) 40 MG tablet   carvedilol (COREG) 25 MG tablet   furosemide (LASIX) 40 MG tablet   sacubitril-valsartan (ENTRESTO) 49-51 MG   spironolactone (ALDACTONE) 25 MG tablet   DM (diabetes mellitus), type 2, uncontrolled (HCC) (Chronic)    I explained to him the importance of needing to follow-up with primary physician to get his sugars checked. He apparently does go to the urgent care at [facility]. I'm not sure when the last time he's had A1c checked.      Relevant Medications   atorvastatin (LIPITOR) 40 MG tablet   Coronary artery calcification  seen on CAT scan    No significant CAD noted on cardiac catheterization. He is on aspirin and statin as well as his beta blocker and now Entresto.      Relevant Medications   atorvastatin (LIPITOR) 40 MG tablet   carvedilol (COREG) 25 MG tablet   furosemide (LASIX) 40 MG tablet   sacubitril-valsartan (ENTRESTO) 49-51 MG   spironolactone (ALDACTONE) 25 MG tablet   Other Relevant Orders   EKG 12-Lead   Brain natriuretic peptide   Comprehensive metabolic panel   Chronic combined systolic and diastolic heart failure, NYHA class 1 (HCC) - Primary (Chronic)    Despite severe cardiomyopathy, he is relatively asymptomatic now on medications. We need to continue to push his  head antihypertensive regimen. He is on stable dose of Lasix. We talked about sliding scale Lasix. Hopefully with better blood pressure control this will be an issue. We'll also start spironolactone 25 mg daily  Again reassess EF once he is stable regimen of Entresto. We will check him BNP level when we get his CMP prior to follow-up with Erasmo Downer.      Relevant Medications   atorvastatin (LIPITOR) 40 MG tablet   carvedilol (COREG) 25 MG tablet   furosemide (LASIX) 40 MG tablet   sacubitril-valsartan (ENTRESTO) 49-51 MG   spironolactone (ALDACTONE) 25 MG tablet   Other Relevant Orders   EKG 12-Lead   Brain natriuretic peptide   Comprehensive metabolic panel    Other Visit Diagnoses    Polypharmacy        Relevant Orders    EKG 12-Lead    Brain natriuretic peptide    Comprehensive metabolic panel    DOE (dyspnea on exertion)        Relevant Orders    Brain natriuretic peptide    Comprehensive metabolic panel       Current medicines are reviewed at length with the patient today. (+/- concerns) none; round out of medications  The following changes have been made:  STOP LISINOPRIL  START ENTRESTO 49/51 MG - ONE TABLET TWICE A  DAY.  SPIROLACTONE 25 MG  ONE DAILY  AFTER STARTING SPIROLACTONE 2 WEEKS  LATER HAVE LAB WORK DONE.-BNP CMP  Your physician recommends that you schedule a follow-up appointment in [place] physician wants you to follow-up in:4 MONTHS WITH DR Ellyn Hack   Studies Ordered:   Orders Placed This Encounter  Procedures  . Brain natriuretic peptide  . Comprehensive metabolic panel  . EKG 12-Lead      Leonie Man, M.D., M.S. Interventional Cardiologist   Pager # [phone] Phone # [phone] [address] [place], [place] [address]

## 2015-12-11 NOTE — Patient Instructions (Addendum)
STOP LISINOPRIL  START ENTRESTO 49/51 MG - ONE TABLET TWICE A  DAY.  SPIROLACTONE 25 MG  ONE DAILY  AFTER STARTING SPIROLACTONE 2 WEEKS LATER HAVE LAB WORK DONE.-BNP CMP  Your physician recommends that you schedule a follow-up appointment in 2 WEEKS WITH KRISTIN -BLOOD PRESSURE   Your physician wants you to follow-up in:4 MONTHS WITH DR HARDING.  You will receive a reminder letter in the mail two months in advance. If you don't receive a letter, please call our office to schedule the follow-up appointment.  If you need a refill on your cardiac medications before your next appointment, please call your pharmacy.

## 2015-12-13 ENCOUNTER — Encounter: Payer: Self-pay | Admitting: Cardiology

## 2015-12-14 NOTE — Assessment & Plan Note (Signed)
No significant CAD noted on cardiac catheterization. He is on aspirin and statin as well as his beta blocker and now Entresto.

## 2015-12-14 NOTE — Assessment & Plan Note (Addendum)
Despite severe cardiomyopathy, he is relatively asymptomatic now on medications. We need to continue to push his head antihypertensive regimen. He is on stable dose of Lasix. We talked about sliding scale Lasix. Hopefully with better blood pressure control this will be an issue. We'll also start spironolactone 25 mg daily  Again reassess EF once he is stable regimen of Entresto. We will check him BNP level when we get his CMP prior to follow-up with Belenda Cruise.

## 2015-12-14 NOTE — Assessment & Plan Note (Signed)
I explained to him the importance of needing to follow-up with primary physician to get his sugars checked. He apparently does go to the urgent care at [facility]. I'm not sure when the last time he's had A1c checked.

## 2015-12-14 NOTE — Assessment & Plan Note (Signed)
Symptoms are well-controlled. He doesn't have any significant problems as far as heart failure goes. He is even tolerating such a significantly elevated blood pressure. Adjusting medications accordingly with potentially converting to [facility]. He is on carvedilol. On standing dose of furosemide not having to take any when necessary dosing.  I would like to see his blood pressure being better controlled, and potentially on Entresto. We will then consider rechecking an echocardiogram prior to attempting to get him back to electrophysiology.

## 2015-12-14 NOTE — Assessment & Plan Note (Addendum)
Apparently he was at his lisinopril for about a week and half. He says he was taking his carvedilol. Since he has not been on his ACE inhibitor, I will take advantage of this opportunity to convert from ACE inhibitor to Entresto at least moderate dose. He met with Tommy Medal, RPH-CCP to discuss his ability to get Entresto without being on insurance. We've provided some samples today. He will follow up with Erasmo Downer in 2 weeks to reassess. I will also start spironolactone 25 mg. If he continues to be hypertensive, we may need to further titrate Entresto and again consider adding amlodipine.  He will need a CMP checked prior to follow-up with Erasmo Downer

## 2015-12-24 ENCOUNTER — Telehealth: Payer: Self-pay | Admitting: Cardiology

## 2015-12-24 NOTE — Telephone Encounter (Signed)
Returned call,  Goes to [employer] which is not set up to receive messages.

## 2015-12-24 NOTE — Telephone Encounter (Signed)
New message     Patient did not get lab work done on yesterday - does he need to come to [facility] on tomorrow.

## 2015-12-25 ENCOUNTER — Ambulatory Visit: Payer: Self-pay | Admitting: Pharmacist Clinician (PhC)/ Clinical Pharmacy Specialist

## 2015-12-25 NOTE — Telephone Encounter (Signed)
2nd attempt to call pt, no answer and no way to leave VM.

## 2015-12-29 ENCOUNTER — Telehealth: Payer: Self-pay | Admitting: Cardiology

## 2015-12-29 MED ORDER — SACUBITRIL-VALSARTAN 49-51 MG PO TABS: 1.0000 | ORAL_TABLET | Freq: Two times a day (BID) | ORAL | Status: DC

## 2015-12-29 NOTE — Telephone Encounter (Signed)
New message   Pt needs the prescription for Enpresto 49-51 mg   For [facility] in [place] [address] [address]

## 2015-12-29 NOTE — Telephone Encounter (Signed)
Refill sent.

## 2016-03-01 ENCOUNTER — Telehealth: Payer: Self-pay

## 2016-03-01 NOTE — Telephone Encounter (Signed)
Prior auth obtained for Entresto 49-51. Case ID# [id number], is good for 1 year.

## 2016-03-31 ENCOUNTER — Telehealth: Payer: Self-pay | Admitting: Cardiology

## 2016-03-31 NOTE — Telephone Encounter (Signed)
Mr. Chisman is wanting to know if he can be placed on a more affordable medication Sherryll Burger) and is asking if he can go back on the old medication for his blood pressure . Please call   Thanks

## 2016-03-31 NOTE — Telephone Encounter (Signed)
SPOKE WITH PATIENT  PATIENT STATES HE IS UNABLE TO AFFORD ENTRESTO  THE COST WILL BE $430 WITH INSURANCE  PATIENT STATES HE IS IN THE MIDDLE OF CHANGING JOBS AND WILL NOT HAVE THIS INSURANCE  AN THE NEW JOB HAS A GRACE PERIOD -- PATIENT WILL BE MOV[place] TO [place] AND WILL NEED REFERRAL TO CARDIOLOGIST THERE ( WANTED TO KNOW FOR RECOMMENDATION)   INFORMED PATIENT WILL DISCUSS WITH DR Herbie Baltimore AND PHARMACIST   ( SPOKE WITH KRISTIN )-- RESTART PRIOR LISINIOPRIL UNTIL PATIENT IS ESTABLISH WITH INSURANCE WILL DISCUSS WITH DR HARDING.

## 2016-04-01 NOTE — Telephone Encounter (Signed)
Yet another person who can afford the wonderful Entresto  I want to get the drug rep to see all these issues.  I am fine with switching back to lisinopril although this is not ideal.  *Cardiologist in [place] depends on where he was getting living. I know several cardiologists there: Dr. John Giovanni Dr. Marlis Edelson Dr. Chapman Fitch   Bryan Lemma, MD

## 2016-04-02 MED ORDER — LISINOPRIL 40 MG PO TABS: 40.0000 mg | ORAL_TABLET | Freq: Every day | ORAL | Status: DC

## 2016-04-02 NOTE — Telephone Encounter (Signed)
SPOKE TO PATIENT INFORMATION GIVEN - RESTART TAKING LISINOPRIL 40 MG UNTIL YOU ESTABLISH WITH NEW INSURANCE AND JOB   THE NEW DOCTOR  CAN RESTART ENTRESTO PATIENT VERBALIZED UNDERSTANDING.   E-SENT LISINOPRIL 40 MG DAILY  PATIENT WILL CALL BACK WITH NAMES OF CARDIOLOGIST IN THE [place] AREA

## 2016-04-05 ENCOUNTER — Telehealth: Payer: Self-pay | Admitting: Cardiology

## 2016-04-05 NOTE — Telephone Encounter (Signed)
SPOKE TO PATIENT   PATIENT NEED THE RECOMMENDED NAMES FROM THE [facility] AREA AND NOT THE [place] AREA. INFORMATION GIVEN- VERBALIZED UNDERSTANDING

## 2016-04-05 NOTE — Telephone Encounter (Signed)
Pt called in requesting some names for Cardiologist near the [place] area. Please f/u with pt.   Thanks

## 2016-04-13 ENCOUNTER — Ambulatory Visit: Payer: Self-pay | Admitting: Cardiology

## 2016-09-29 ENCOUNTER — Encounter: Payer: Self-pay | Admitting: Physician Assistant

## 2016-12-25 ENCOUNTER — Other Ambulatory Visit: Payer: Self-pay | Admitting: Cardiology

## 2017-01-24 ENCOUNTER — Encounter: Payer: Self-pay | Admitting: Gastroenterology

## 2017-02-08 ENCOUNTER — Other Ambulatory Visit: Payer: Self-pay

## 2017-02-08 ENCOUNTER — Ambulatory Visit: Payer: Self-pay | Admitting: Physician Assistant

## 2017-02-15 ENCOUNTER — Encounter: Payer: Self-pay | Admitting: Gastroenterology

## 2017-09-13 ENCOUNTER — Other Ambulatory Visit: Payer: Self-pay | Admitting: Cardiology

## 2017-09-13 NOTE — Telephone Encounter (Signed)
REFILL 

## 2018-04-26 ENCOUNTER — Other Ambulatory Visit: Payer: Self-pay | Admitting: Cardiology

## 2018-09-27 ENCOUNTER — Encounter: Payer: Self-pay | Admitting: Cardiology

## 2018-09-27 ENCOUNTER — Ambulatory Visit: Payer: Self-pay | Admitting: Cardiology

## 2018-09-27 NOTE — Telephone Encounter (Signed)
error 

## 2018-10-02 ENCOUNTER — Telehealth: Payer: Self-pay | Admitting: Cardiology

## 2018-10-02 ENCOUNTER — Other Ambulatory Visit: Payer: Self-pay | Admitting: Cardiology

## 2018-10-02 MED ORDER — CARVEDILOL 25 MG PO TABS: 25.0000 mg | ORAL_TABLET | Freq: Two times a day (BID) | ORAL | 0 refills | Status: DC

## 2018-10-02 MED ORDER — FUROSEMIDE 40 MG PO TABS: 40.0000 mg | ORAL_TABLET | Freq: Every day | ORAL | 0 refills | Status: DC

## 2018-10-02 MED ORDER — LISINOPRIL 40 MG PO TABS: 40.0000 mg | ORAL_TABLET | Freq: Every day | ORAL | 0 refills | Status: DC

## 2018-10-02 MED ORDER — SPIRONOLACTONE 25 MG PO TABS: 25.0000 mg | ORAL_TABLET | Freq: Every day | ORAL | 0 refills | Status: DC

## 2018-10-02 NOTE — Telephone Encounter (Signed)
Sent to requested pharmacy. Advised that patient needed appointment for further refills. Overdue for appointment.

## 2018-10-02 NOTE — Telephone Encounter (Signed)
°*  STAT* If patient is at the pharmacy, call can be transferred to refill team   1. Which medications need to be refilled? (please list name of each medication and dose if known) Carvedilol 25mg , Lisinopril 40 mg  2. Which pharmacy/location (including street and city if local pharmacy) is medication to be sent to?[facility] in [place] as well as two that were called into the wrong pharmacy Furosimide 40mg  and Spironolactone 25mg    3. Do they need a 30 day or 90 day supply? 30 day  PT WOULD LIKE TO HAVE ALL PRESCRIPTIONS CALLED INTO [facility] [address] IN [facility]

## 2018-10-02 NOTE — Telephone Encounter (Signed)
° ° ° °*  STAT* If patient is at the pharmacy, call can be transferred to refill team.   1. Which medications need to be refilled? (please list name of each medication and dose if known) spironolactone (ALDACTONE) 25 MG tablet, lisinopril (PRINIVIL,ZESTRIL) 40 MG tablet,  furosemide (LASIX) 40 MG tablet, carvedilol (COREG) 25 MG tablet 2. Which pharmacy/location (including street and city if local pharmacy) is medication to be sent to? [facility] [address] [place] - [address]  3. Do they need a 30 day or 90 day supply? 90

## 2018-11-14 ENCOUNTER — Ambulatory Visit (INDEPENDENT_AMBULATORY_CARE_PROVIDER_SITE_OTHER): Payer: Self-pay | Admitting: Cardiology

## 2018-11-14 ENCOUNTER — Encounter: Payer: Self-pay | Admitting: Cardiology

## 2018-11-14 VITALS — BP 164/98 | HR 76 | Ht 69.0 in | Wt 163.0 lb

## 2018-11-14 DIAGNOSIS — I5042 Chronic combined systolic (congestive) and diastolic (congestive) heart failure: Secondary | ICD-10-CM

## 2018-11-14 DIAGNOSIS — I428 Other cardiomyopathies: Secondary | ICD-10-CM

## 2018-11-14 DIAGNOSIS — I2510 Atherosclerotic heart disease of native coronary artery without angina pectoris: Secondary | ICD-10-CM

## 2018-11-14 DIAGNOSIS — I10 Essential (primary) hypertension: Secondary | ICD-10-CM

## 2018-11-14 MED ORDER — SPIRONOLACTONE 25 MG PO TABS: 25.0000 mg | ORAL_TABLET | Freq: Every day | ORAL | 6 refills | Status: AC

## 2018-11-14 MED ORDER — CARVEDILOL 25 MG PO TABS: 25.0000 mg | ORAL_TABLET | Freq: Two times a day (BID) | ORAL | 6 refills | Status: DC

## 2018-11-14 MED ORDER — LISINOPRIL 40 MG PO TABS: 40.0000 mg | ORAL_TABLET | Freq: Every day | ORAL | 6 refills | Status: DC

## 2018-11-14 MED ORDER — FUROSEMIDE 40 MG PO TABS: 40.0000 mg | ORAL_TABLET | Freq: Every day | ORAL | 6 refills | Status: DC

## 2018-11-14 NOTE — Assessment & Plan Note (Addendum)
Blood pressure actually seems to be doing okay, not that high considering these off multiple medications. Plan: Restart carvedilol, lisinopril and spironolactone at regular doses.

## 2018-11-14 NOTE — Assessment & Plan Note (Signed)
Cardiac cath showed no significant CAD.  Remains on aspirin, but no longer statin.  Will defer to PCP who is monitoring his diabetes.  Restarting beta-blocker and ACE inhibitor with suggestion of changing to [employer]

## 2018-11-14 NOTE — Progress Notes (Signed)
PCP: Marcille Blanco, MD  Clinic Note: No chief complaint on file.   HPI: Jerry Bean is a 63 y.o. male with a PMH notable for NICM EF 25-30% (lost to follow-up since 2017) who presents today for ~ 2 y/r f/u .  Jerry Bean was last seen in February 2017.  This was a follow-up from an echocardiogram that showed stable EF of 25 to 30%, but only class I heart failure symptoms.  At that time the plan was to convert him from lisinopril to [facility].  Unfortunately in the interim since I last saw him, he has moved to [place] after completing his degree and establish a new career.  Recent Hospitalizations: None   Studies Personally Reviewed - (if available, images/films reviewed: From Epic Chart or Care Everywhere)  Echo 05/2015 - EF 35-30% (stable) - but only mildly dilated.  Diffuse HK. Gr 1 DD. Mod LAd dilation.   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 He is happy both professionally & personally/emotionally.  Has been doing well - monitoring BP & volume & weight -- somehow - his Rx's lasted until just before Thanksgiving last year -- was not able to get an appt until now -- so he has been out of meds since last week.   Doing well despite being out of meds for ~ 1 week. No untoward's SSx of HA/dizziness or exacerbation of CHF.  Exercising more.   Watches daily weight. Also monitoring diet & CBGs b/c DM.    CV ROS: No chest pain or shortness of breath with rest or exertion.  No PND, orthopnea or edema. No palpitations, lightheadedness, dizziness, weakness or syncope/near syncope. No TIA/amaurosis fugax symptoms No claudication.  ROS: A comprehensive was performed. Review of Systems  Constitutional: Negative for malaise/fatigue and weight loss.  HENT: Negative for congestion and nosebleeds.   Respiratory: Negative.  Negative for cough and shortness of breath.   Cardiovascular:  Negative.        Per HPI  Gastrointestinal: Negative for blood in stool and melena.  Genitourinary: Negative for hematuria.  Neurological: Negative for dizziness (only on occasion if he gets up to fast).  Psychiatric/Behavioral: Negative.   All other systems reviewed and are negative.   I have reviewed and (if needed) personally updated the patient's problem list, medications, allergies, past medical and surgical history, social and family history.   Past Medical History:  Diagnosis Date  . Diabetes mellitus type 2, uncontrolled (HCC)   . Essential hypertension   . GERD (gastroesophageal reflux disease)   . Nonischemic cardiomyopathy [facility]) March 2016   Mild CAD only with 70% SP1. Echo: EF 20-25%. GR 2 DD, elevated LVEDP. Severe LAE, moderate MR;; b. Follow-up echo June 2016: EF 25-30%. Severe diffuse hypokinesis with no RWMA. Grade 1 diastolic dysfunction. Moderate LA dilation.    Past Surgical History:  Procedure Laterality Date  . LEFT AND RIGHT HEART CATHETERIZATION WITH CORONARY ANGIOGRAM N/A 01/07/2015   Procedure: LEFT AND RIGHT HEART CATHETERIZATION WITH CORONARY ANGIOGRAM;  Surgeon: Lennette Bihari, MD;  Location: [facility] CATH LAB;  Service: Cardiovascular;  very large left main. Dominant circumflex with 20% stenosis. LAD SP1 has 70% area for diagonal branches. LAD wraps the apex. Small nondominant right. EF by LV gram 15-20% with hypo-akinesis of the distal anterolateral wall.  . TRANSTHORACIC ECHOCARDIOGRAM  01/05/2015   EF 20-25%, diffuse Hypokinesis, Grade 2 DD, mild Ao root dilation, Mod  MR, Severe LA dilation, Trivial AI  . TRANSTHORACIC ECHOCARDIOGRAM  04/30/2015   Mild LV dilation. Severely reduced systolic function. EF 25-30%. Diffuse HK with no RWMA. Only GR 1 DD. Moderate LA dilation.    Current Meds  Medication Sig  . aspirin EC 81 MG EC tablet Take 1 tablet (81 mg total) by mouth daily.  . carvedilol (COREG) 25 MG tablet Take 1 tablet (25 mg total) by mouth 2 (two) times  daily.  . furosemide (LASIX) 40 MG tablet Take 1 tablet (40 mg total) by mouth daily.  Marland Kitchen lisinopril (PRINIVIL,ZESTRIL) 40 MG tablet Take 1 tablet (40 mg total) by mouth daily.  . metFORMIN (GLUCOPHAGE) 1000 MG tablet Take 1,000 mg by mouth 2 (two) times daily with a meal.  . omeprazole (PRILOSEC) 20 MG capsule Take 20 mg by mouth daily.  Marland Kitchen spironolactone (ALDACTONE) 25 MG tablet Take 1 tablet (25 mg total) by mouth daily.  Marland Kitchen VIAGRA 100 MG tablet Take 100 mg by mouth as needed for erectile dysfunction (pt has not tried medication yet).   . [DISCONTINUED] atorvastatin (LIPITOR) 40 MG tablet TAKE ONE TABLET BY MOUTH ONCE DAILY AT  6PM  . [DISCONTINUED] carvedilol (COREG) 25 MG tablet TAKE ONE TABLET BY MOUTH TWICE DAILY  . [DISCONTINUED] carvedilol (COREG) 25 MG tablet Take 1 tablet (25 mg total) by mouth 2 (two) times daily.  . [DISCONTINUED] furosemide (LASIX) 40 MG tablet Take 1 tablet (40 mg total) by mouth daily.  . [DISCONTINUED] glipiZIDE (GLUCOTROL) 10 MG tablet Take 10 mg by mouth 2 (two) times daily before a meal.  . [DISCONTINUED] lisinopril (PRINIVIL,ZESTRIL) 40 MG tablet Take 1 tablet (40 mg total) by mouth daily.  . [DISCONTINUED] spironolactone (ALDACTONE) 25 MG tablet Take 1 tablet (25 mg total) by mouth daily.    No Known Allergies  Social History   Tobacco Use  . Smoking status: Never Smoker  . Smokeless tobacco: Never Used  Substance Use Topics  . Alcohol use: No  . Drug use: No   Social History   Social History Narrative   As of March 2016, he was undergoing a divorce. He is now currently living with his sister who is coming in today. They are actually living in [place]. He is still working on his graduate degree in religion/religious counseling. 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    family history includes AAA (abdominal aortic aneurysm) (age of onset: 62) in his brother; Diabetes in his mother.  Wt Readings from Last  3 Encounters:  11/14/18 163 lb (73.9 kg)  12/11/15 168 lb (76.2 kg)  05/26/15 161 lb (73 kg)    PHYSICAL EXAM BP (!) 164/98   Pulse 76   Ht 5\' 9"  (1.753 m)   Wt 163 lb (73.9 kg)   BMI 24.07 kg/m  Physical Exam  Constitutional: He is oriented to person, place, and time. He appears well-developed and well-nourished. No distress.  Well groomed, well nourished. Healthy appearing  HENT:  Head: Normocephalic and atraumatic.  Eyes: Conjunctivae are normal.  Wearing glasses  Neck: Normal range of motion. Neck supple. Hepatojugular reflux (8 cmH20) present. No JVD present. Carotid bruit is not present. No thyromegaly present.  Cardiovascular: Normal rate, S1 normal, S2 normal, intact distal pulses and normal pulses.  Occasional extrasystoles are present. PMI is displaced (mild lateral displacement.  Hyperdynamic). Exam reveals gallop and S4. Exam reveals no friction rub.  No murmur heard. Pulmonary/Chest: Effort normal and breath  sounds normal. No respiratory distress. He has no wheezes. He has no rales.  Abdominal: Soft. Bowel sounds are normal. He exhibits no distension. There is no abdominal tenderness. There is no rebound.  No HSM.    Musculoskeletal: Normal range of motion.        General: No edema.  Neurological: He is alert and oriented to person, place, and time.  Skin: He is not diaphoretic.  Psychiatric: He has a normal mood and affect. His behavior is normal. Judgment and thought content normal.  Vitals reviewed.    Adult ECG Report  Rate: 76 ;  Rhythm: normal sinus rhythm, premature ventricular contractions (PVC) and LVH. LAD (-41). RBBB. Normal intervals & durations.;   Narrative Interpretation: Stable from 2016.   Other studies Reviewed: Additional studies/ records that were reviewed today include:  Recent Labs:  Not available   ASSESSMENT / PLAN: Problem List Items Addressed This Visit    Chronic combined systolic and diastolic heart failure, NYHA class 1 (HCC)  (Chronic)    Class I symptoms despite being off medications. Restart meds as noted.  Needs more local cardiologist in [place].  If he does not have one established by this summer, we will see him back and recheck echo.      Relevant Medications   lisinopril (PRINIVIL,ZESTRIL) 40 MG tablet   spironolactone (ALDACTONE) 25 MG tablet   carvedilol (COREG) 25 MG tablet   furosemide (LASIX) 40 MG tablet   Other Relevant Orders   EKG 12-Lead   Coronary artery calcification seen on CAT scan (Chronic)    Cardiac cath showed no significant CAD.  Remains on aspirin, but no longer statin.  Will defer to PCP who is monitoring his diabetes.  Restarting beta-blocker and ACE inhibitor with suggestion of changing to Entresto      Relevant Medications   lisinopril (PRINIVIL,ZESTRIL) 40 MG tablet   spironolactone (ALDACTONE) 25 MG tablet   carvedilol (COREG) 25 MG tablet   furosemide (LASIX) 40 MG tablet   NICM (nonischemic cardiomyopathy) (HCC) - Primary (Chronic)    Seems to be doing well from a symptoms standpoint.  Not seemingly consistent with EF 25-30%. NO CHF Sx despite being off meds.  He seems euvolemic.  Was doing very well with monitoring his daily weights etc.  Edema has been well controlled.  Plan - restart original meds --carvedilol 25 mg twice daily, lisinopril 40 mg, spironolactone 25 mg, Lasix 40 mg.  Recommend that he discuss with his new PCP potentially getting established with a cardiologist in [place]. -->  He would be due for follow-up echocardiogram based on his improved symptoms.  If EF is still reduced, would probably benefit from being [facility].  This was the plan in the past, but because of loss of follow-up he did not get started.  Also, if EF is still down, would probably benefit from discussion about possible ICD in the future.      Relevant Medications   lisinopril (PRINIVIL,ZESTRIL) 40 MG tablet   spironolactone (ALDACTONE) 25 MG tablet    carvedilol (COREG) 25 MG tablet   furosemide (LASIX) 40 MG tablet   Other Relevant Orders   EKG 12-Lead   Uncontrolled hypertension (Chronic)    Blood pressure actually seems to be doing okay, not that high considering these off multiple medications. Plan: Restart carvedilol, lisinopril and spironolactone at regular doses.      Relevant Medications   lisinopril (PRINIVIL,ZESTRIL) 40 MG tablet   spironolactone (ALDACTONE) 25 MG  tablet   carvedilol (COREG) 25 MG tablet   furosemide (LASIX) 40 MG tablet       I spent a total of with the patient and chart review. >  50% of the time was spent in direct patient consultation.   Current medicines are reviewed at length with the patient today.  (+/- concerns)  -- needs refills of meds The following changes have been made:  refilled prior meds. Defer statin to PCP.  Need to establish New Cardiologist in [place].   Patient Instructions  Medication Instructions:  NO CHANGES If you need a refill on your cardiac medications before your next appointment, please call your pharmacy.   Lab work: NOT NEEDED If you have labs (blood work) drawn today and your tests are completely normal, you will receive your results only by: Marland Kitchen MyChart Message (if you have MyChart) OR . A paper copy in the mail If you have any lab test that is abnormal or we need to change your treatment, we will call you to review the results.  Testing/Procedures: NOT NEEDED  Follow-Up: RECOMMEND DISCUSSING WITH PCP - NEED TO TRANSFER CARDIOLOGY CARE TO [place].  PCP CAN RECOMMEND & REFER.  At [facility], you and your health needs are our priority.  As part of our continuing mission to provide you with exceptional heart care, we have created designated Provider Care Teams.  These Care Teams include your primary Cardiologist (physician) and Advanced Practice Providers (APPs -  Physician Assistants and Nurse Practitioners) who all work together to provide you  with the care you need, when you need it. You will need a follow up appointment in 6 months July 2020 -- IF NOT ESTABLISHED WITH A LOCAL CARDIOLOGIST IN [place].Marland Kitchen  Please call our office 2 months in advance to schedule this appointment.  You may see DR Herbie Baltimore or one of the following Advanced Practice Providers on your designated Care Team:   Theodore Demark, PA-C . Joni Reining, DNP, ANP  Any Other Special Instructions Will Be Listed Below (If Applicable).     Studies Ordered:   Orders Placed This Encounter  Procedures  . EKG 12-Lead      Bryan Lemma, M.D., M.S. Interventional Cardiologist   Pager # [phone] Phone # [phone] [address] [place], [place] [address]   Thank you for choosing Heartcare at [facility]!!

## 2018-11-14 NOTE — Assessment & Plan Note (Signed)
Class I symptoms despite being off medications. Restart meds as noted.  Needs more local cardiologist in [place].  If he does not have one established by this summer, we will see him back and recheck echo.

## 2018-11-14 NOTE — Assessment & Plan Note (Signed)
Seems to be doing well from a symptoms standpoint.  Not seemingly consistent with EF 25-30%. NO CHF Sx despite being off meds.  He seems euvolemic.  Was doing very well with monitoring his daily weights etc.  Edema has been well controlled.  Plan - restart original meds --carvedilol 25 mg twice daily, lisinopril 40 mg, spironolactone 25 mg, Lasix 40 mg.  Recommend that he discuss with his new PCP potentially getting established with a cardiologist in [place]. -->  He would be due for follow-up echocardiogram based on his improved symptoms.  If EF is still reduced, would probably benefit from being switched from lisinopril to [facility].  This was the plan in the past, but because of loss of follow-up he did not get started.  Also, if EF is still down, would probably benefit from discussion about possible ICD in the future.

## 2018-11-14 NOTE — Patient Instructions (Addendum)
Medication Instructions:  NO CHANGES If you need a refill on your cardiac medications before your next appointment, please call your pharmacy.   Lab work: NOT NEEDED If you have labs (blood work) drawn today and your tests are completely normal, you will receive your results only by: Marland Kitchen MyChart Message (if you have MyChart) OR . A paper copy in the mail If you have any lab test that is abnormal or we need to change your treatment, we will call you to review the results.  Testing/Procedures: NOT NEEDED  Follow-Up: RECOMMEND DISCUSSING WITH PCP - NEED TO TRANSFER CARDIOLOGY CARE TO [place].  PCP CAN RECOMMEND & REFER.  At [facility], you and your health needs are our priority.  As part of our continuing mission to provide you with exceptional heart care, we have created designated Provider Care Teams.  These Care Teams include your primary Cardiologist (physician) and Advanced Practice Providers (APPs -  Physician Assistants and Nurse Practitioners) who all work together to provide you with the care you need, when you need it. You will need a follow up appointment in 6 months July 2020 -- IF NOT ESTABLISHED WITH A LOCAL CARDIOLOGIST IN [place].Marland Kitchen  Please call our office 2 months in advance to schedule this appointment.  You may see DR Herbie Baltimore or one of the following Advanced Practice Providers on your designated Care Team:   Theodore Demark, PA-C . Joni Reining, DNP, ANP  Any Other Special Instructions Will Be Listed Below (If Applicable).

## 2019-06-06 ENCOUNTER — Other Ambulatory Visit: Payer: Self-pay | Admitting: Cardiology

## 2019-07-17 ENCOUNTER — Other Ambulatory Visit: Payer: Self-pay | Admitting: Cardiology

## 2019-08-07 ENCOUNTER — Other Ambulatory Visit: Payer: Self-pay | Admitting: Cardiology

## 2019-09-17 ENCOUNTER — Other Ambulatory Visit: Payer: Self-pay | Admitting: Cardiology

## 2019-10-11 ENCOUNTER — Other Ambulatory Visit: Payer: Self-pay | Admitting: Cardiology

## 2020-01-08 ENCOUNTER — Encounter: Payer: Self-pay | Admitting: General Practice

## 2020-05-06 ENCOUNTER — Other Ambulatory Visit: Payer: Self-pay | Admitting: Cardiology

## 2020-05-06 MED ORDER — FUROSEMIDE 40 MG PO TABS: 40.0000 mg | ORAL_TABLET | Freq: Every day | ORAL | 0 refills | Status: AC

## 2020-05-06 MED ORDER — CARVEDILOL 25 MG PO TABS: 25.0000 mg | ORAL_TABLET | Freq: Two times a day (BID) | ORAL | 0 refills | Status: AC

## 2020-05-06 NOTE — Telephone Encounter (Signed)
° ° ° °*  STAT* If patient is at the pharmacy, call can be transferred to refill team.   1. Which medications need to be refilled? (please list name of each medication and dose if known)   carvedilol (COREG) 25 MG tablet    furosemide (LASIX) 40 MG tablet    2. Which pharmacy/location (including street and city if local pharmacy) is medication to be sent to? [facility] [address], [place] - [address]  3. Do they need a 30 day or 90 day supply? 90 days  Pt is leaving thursday to go out [place] the [place], he said he needs refill today or tomorrow
